# Patient Record
Sex: Male | Born: 1990 | Race: Black or African American | Hispanic: No | Marital: Married | Smoking: Never smoker
Health system: Southern US, Community
[De-identification: ages and names within clinical notes are randomized; demographics above are authoritative.]

## PROBLEM LIST (undated history)

## (undated) DIAGNOSIS — R011 Cardiac murmur, unspecified: Secondary | ICD-10-CM

## (undated) HISTORY — PX: OTHER SURGICAL HISTORY: SHX169

---
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
## 2012-07-06 ENCOUNTER — Emergency Department (HOSPITAL_COMMUNITY)
Admission: EM | Admit: 2012-07-06 | Discharge: 2012-07-06 | Disposition: A | Discharge status: Home / Self Care | Payer: Self-pay | Attending: Emergency Medicine | Admitting: Emergency Medicine

## 2012-07-06 ENCOUNTER — Emergency Department (HOSPITAL_COMMUNITY): Payer: Self-pay

## 2012-07-06 DIAGNOSIS — M25579 Pain in unspecified ankle and joints of unspecified foot: Secondary | ICD-10-CM | POA: Insufficient documentation

## 2012-07-06 DIAGNOSIS — Y9367 Activity, basketball: Secondary | ICD-10-CM | POA: Insufficient documentation

## 2012-07-06 DIAGNOSIS — Y92838 Other recreation area as the place of occurrence of the external cause: Secondary | ICD-10-CM | POA: Insufficient documentation

## 2012-07-06 DIAGNOSIS — S9000XA Contusion of unspecified ankle, initial encounter: Secondary | ICD-10-CM | POA: Insufficient documentation

## 2012-07-06 DIAGNOSIS — S93409A Sprain of unspecified ligament of unspecified ankle, initial encounter: Secondary | ICD-10-CM | POA: Insufficient documentation

## 2012-07-06 DIAGNOSIS — X500XXA Overexertion from strenuous movement or load, initial encounter: Secondary | ICD-10-CM | POA: Insufficient documentation

## 2012-07-06 DIAGNOSIS — M7989 Other specified soft tissue disorders: Secondary | ICD-10-CM | POA: Insufficient documentation

## 2012-07-06 DIAGNOSIS — Y9239 Other specified sports and athletic area as the place of occurrence of the external cause: Secondary | ICD-10-CM | POA: Insufficient documentation

## 2012-07-06 NOTE — ED Notes (Signed)
Pt was playing basketball last night and came down on someone else's foot. Pt states he rolled R ankle. Pt states he worked all day today and now pain is worse. Pt states pain now radiates up to lower back. Pt ambulatory at triage to exam room. Pt has bruising and swelling to R ankle.

## 2012-07-06 NOTE — ED Notes (Signed)
Pt states he drove himself here ?

## 2012-07-06 NOTE — ED Provider Notes (Signed)
History     CSN: [id number]  Arrival date & time 07/06/12  2014   First MD Initiated Contact with Patient 07/06/12 2035      Chief Complaint  Patient presents with  . Ankle Injury    (Consider location/radiation/quality/duration/timing/severity/associated sxs/prior treatment) HPI Comments: 21 year old male presents emergency department with right ankle pain after rolling it while playing basketball yesterday. States he was going to the ball when he jumped down and landed on miles and rolled his ankle. He try to "tough it out" last night and today at work, however he states the pain became worse causing his foot to be numb today. Describes the pain as constant, throbbing rated 8/10. He took Tylenol with only mild relief. Walking and moving his ankle makes the pain worse. It has become more swollen and bruised. Pain radiating up the side of his leg to his knee.  Patient is a 21 y.o. male presenting with lower extremity injury. The history is provided by the patient.  Ankle Injury Associated symptoms include arthralgias and joint swelling.    No past medical history on file.  No past surgical history on file.  No family history on file.  History  Substance Use Topics  . Smoking status: Not on file  . Smokeless tobacco: Not on file  . Alcohol Use: Not on file      Review of Systems  Musculoskeletal: Positive for joint swelling and arthralgias.  Skin: Positive for color change.    Allergies  Review of patient's allergies indicates no known allergies.  Home Medications   Current Outpatient Rx  Name  Route  Sig  Dispense  Refill  . ACETAMINOPHEN 325 MG PO TABS   Oral   Take 650 mg by mouth every 6 (six) hours as needed. Pain           BP 126/72  Pulse 89  Temp 99.3 F (37.4 C) (Oral)  Resp 17  SpO2 100%  Physical Exam  Nursing note and vitals reviewed. Constitutional: He is oriented to person, place, and time. He appears well-developed and well-nourished.  No distress.  HENT:  Head: Normocephalic and atraumatic.  Eyes: Conjunctivae normal are normal.  Neck: Normal range of motion.  Cardiovascular: Normal rate, regular rhythm, normal heart sounds and intact distal pulses.   Pulses:      Dorsalis pedis pulses are 2+ on the right side.       Posterior tibial pulses are 2+ on the right side.  Pulmonary/Chest: Effort normal and breath sounds normal.  Musculoskeletal:       Right ankle: He exhibits decreased range of motion (in all directions due to pain), swelling and ecchymosis. He exhibits normal pulse. tenderness. Lateral malleolus, medial malleolus, AITFL, CF ligament and posterior TFL tenderness found. Achilles tendon normal.  Neurological: He is alert and oriented to person, place, and time. No sensory deficit.  Skin: Skin is warm and dry. Bruising and ecchymosis noted.  Psychiatric: He has a normal mood and affect. His behavior is normal.    ED Course  Procedures (including critical care time)  Labs Reviewed - No data to display Dg Ankle Complete Right  07/06/2012  *RADIOLOGY REPORT*  Clinical Data: Injury  RIGHT ANKLE - COMPLETE 3+ VIEW  Comparison: None.  Findings: No acute fracture and no dislocation.  Severe soft tissue swelling about the ankle most prominent overlying the lateral malleolus. Moderate pes planus deformity.  IMPRESSION: No acute fracture or dislocation.  Marked soft tissue swelling about the ankle  is noted.   Original Report Authenticated By: Jolaine Click, M.D.      1. Ankle sprain       MDM  21 year old male with right ankle sprain. Conservative measures discussed in detail. Air splint applied along with crutches given.        Trevor Mace, PA-C 07/06/12 2117

## 2012-07-06 NOTE — ED Provider Notes (Signed)
Medical screening examination/treatment/procedure(s) were performed by non-physician practitioner and as supervising physician I was immediately available for consultation/collaboration.  Yuki Brunsman K Linker, MD 07/06/12 2146 

## 2013-04-17 ENCOUNTER — Emergency Department (HOSPITAL_COMMUNITY)
Admission: EM | Admit: 2013-04-17 | Discharge: 2013-04-17 | Disposition: A | Discharge status: Home / Self Care | Payer: Self-pay | Attending: Emergency Medicine | Admitting: Emergency Medicine

## 2013-04-17 ENCOUNTER — Encounter (HOSPITAL_COMMUNITY): Payer: Self-pay | Admitting: Emergency Medicine

## 2013-04-17 DIAGNOSIS — L293 Anogenital pruritus, unspecified: Secondary | ICD-10-CM | POA: Insufficient documentation

## 2013-04-17 MED ORDER — HYDROXYZINE HCL 25 MG PO TABS: 25.0000 mg | ORAL_TABLET | Freq: Four times a day (QID) | ORAL | Status: DC

## 2013-04-17 NOTE — ED Provider Notes (Signed)
CSN: [id number]     Arrival date & time 04/17/13  1630 History  This chart was scribed for Randy Sites, PA working with Richardean Canal, MD by Quintella Reichert, ED Scribe. This patient was seen in room WTR6/WTR6 and the patient's care was started at 6:16 PM.    Chief Complaint  Patient presents with  . Penis Itching    The history is provided by the patient. No language interpreter was used.    HPI Comments: KOUPER SPINELLA is a 22 y.o. male who presents to the Emergency Department complaining of one day of intermittent itching on the underside of his penis and top of his testicles.  No itching in pubic hair.  He states that itching is intermittent with no known precipitating factors and is relieved temporarily by scratching.  He denies penile pain, dysuria, or discharge.  He has not taken any Benadryl or other medications pta.  Pt denies new sexual partners but states it is possible he may have an STD.  He denies prior h/o STDs.  He denies h/o UTIs.  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  Denies any fevers, sweats, or chills.   History reviewed. No pertinent past medical history.   History reviewed. No pertinent past surgical history.   Family History  Problem Relation Age of Onset  . Hypertension Mother     History  Substance Use Topics  . Smoking status: Never Smoker   . Smokeless tobacco: Not on file  . Alcohol Use: Yes     Review of Systems  Genitourinary: Negative for dysuria, discharge and penile pain.       Penile itching  All other systems reviewed and are negative.      Allergies  Review of patient's allergies indicates no known allergies.  Home Medications  No current outpatient prescriptions on file.  BP 130/64  Pulse 69  Temp(Src) 99 F (37.2 C) (Oral)  Resp 18  SpO2 99%  Physical Exam  Nursing note and vitals reviewed. Constitutional: He is oriented to person, place, and time. He appears well-developed and  well-nourished. No distress.  HENT:  Head: Normocephalic and atraumatic.  Eyes: Conjunctivae and EOM are normal. Pupils are equal, round, and reactive to light.  Neck: Normal range of motion.  Cardiovascular: Normal rate, regular rhythm and normal heart sounds.   Pulmonary/Chest: Effort normal and breath sounds normal. No respiratory distress. He has no wheezes.  Abdominal: Soft.  Genitourinary: Penis normal. Right testis shows no tenderness. Left testis shows no tenderness. Circumcised. No phimosis, paraphimosis, hypospadias, penile erythema or penile tenderness. No discharge found.  Multiple cystic structures to both testicles-- non-tender, non-draining; no evidence of fungal or yeast infection, herpetic lesions, bites, or other rash present; no penile discharge or erythema  Musculoskeletal: Normal range of motion.  Neurological: He is alert and oriented to person, place, and time.  Skin: Skin is warm and dry. He is not diaphoretic.  Psychiatric: He has a normal mood and affect.    ED Course  Procedures (including critical care time)  DIAGNOSTIC STUDIES: Oxygen Saturation is 99% on room air, normal by my interpretation.    COORDINATION OF CARE: 6:22 PM: Discussed treatment plan which includes STD test and anti-itch medication.  Advised return precautions.  Pt expressed understanding and agreed to plan.   Labs Review Labs Reviewed - No data to display  Imaging Review No results found.  MDM   1. Itching of penis  Penile itching limited to dorsal surface and top of tesitcles without known cause.  No apparent fungal or yeast infection, herpetic lesions, other rash, or bug bites present.  Pt has multiple cystic structures to bilateral testicles-- this is not areas of sx and pt states they have been evaluated before and were told they were benign.  Rx atarax.  Gc/Chl pending-- informed he will be notified if results abnormal or require tx.  Discussed plan with pt, they agreed.   Return precautions advised.  I personally performed the services described in this documentation, which was scribed in my presence. The recorded information has been reviewed and is accurate.  Garlon Hatchet, PA-C 04/17/13 1933

## 2013-04-17 NOTE — ED Notes (Signed)
Pt reports penile itching x 2 days. Denies pain or drainage

## 2013-04-17 NOTE — ED Provider Notes (Signed)
Medical screening examination/treatment/procedure(s) were performed by non-physician practitioner and as supervising physician I was immediately available for consultation/collaboration.   David H Yao, MD 04/17/13 2311 

## 2014-07-11 ENCOUNTER — Emergency Department (HOSPITAL_COMMUNITY)
Admission: EM | Admit: 2014-07-11 | Discharge: 2014-07-11 | Disposition: A | Discharge status: Home / Self Care | Payer: 59 | Attending: Emergency Medicine | Admitting: Emergency Medicine

## 2014-07-11 ENCOUNTER — Encounter (HOSPITAL_COMMUNITY): Payer: Self-pay | Admitting: Emergency Medicine

## 2014-07-11 ENCOUNTER — Emergency Department (HOSPITAL_COMMUNITY): Payer: 59

## 2014-07-11 DIAGNOSIS — J029 Acute pharyngitis, unspecified: Secondary | ICD-10-CM | POA: Insufficient documentation

## 2014-07-11 DIAGNOSIS — R51 Headache: Secondary | ICD-10-CM | POA: Insufficient documentation

## 2014-07-11 DIAGNOSIS — J159 Unspecified bacterial pneumonia: Secondary | ICD-10-CM | POA: Insufficient documentation

## 2014-07-11 DIAGNOSIS — R05 Cough: Secondary | ICD-10-CM | POA: Diagnosis present

## 2014-07-11 DIAGNOSIS — H9201 Otalgia, right ear: Secondary | ICD-10-CM | POA: Diagnosis not present

## 2014-07-11 DIAGNOSIS — R109 Unspecified abdominal pain: Secondary | ICD-10-CM | POA: Diagnosis not present

## 2014-07-11 DIAGNOSIS — M2550 Pain in unspecified joint: Secondary | ICD-10-CM | POA: Insufficient documentation

## 2014-07-11 DIAGNOSIS — J189 Pneumonia, unspecified organism: Secondary | ICD-10-CM

## 2014-07-11 DIAGNOSIS — R059 Cough, unspecified: Secondary | ICD-10-CM

## 2014-07-11 MED ORDER — AZITHROMYCIN 250 MG PO TABS: 250.0000 mg | ORAL_TABLET | Freq: Every day | ORAL | Status: DC

## 2014-07-11 MED ORDER — PROMETHAZINE-DM 6.25-15 MG/5ML PO SYRP: 5.0000 mL | ORAL_SOLUTION | Freq: Four times a day (QID) | ORAL | Status: DC | PRN

## 2014-07-11 MED ORDER — AZITHROMYCIN 250 MG PO TABS
500.0000 mg | ORAL_TABLET | Freq: Once | ORAL | Status: AC
  Administered 2014-07-11: 500 mg via ORAL
  Filled 2014-07-11: qty 2

## 2014-07-11 MED ORDER — CEFTRIAXONE SODIUM 1 G IJ SOLR
1.0000 g | Freq: Once | INTRAMUSCULAR | Status: AC
  Administered 2014-07-11: 1 g via INTRAMUSCULAR
  Filled 2014-07-11: qty 10

## 2014-07-11 MED ORDER — IBUPROFEN 200 MG PO TABS
600.0000 mg | ORAL_TABLET | Freq: Once | ORAL | Status: AC
  Administered 2014-07-11: 600 mg via ORAL
  Filled 2014-07-11: qty 3

## 2014-07-11 MED ORDER — LIDOCAINE HCL 1 % IJ SOLN
INTRAMUSCULAR | Status: AC
  Administered 2014-07-11: 20 mL
  Filled 2014-07-11: qty 20

## 2014-07-11 NOTE — Discharge Instructions (Signed)
Your x-ray shows you have early stages of pneumonia.  You've been given antibiotic injection as well as tablets in the emergency department.  U been given a prescription for additional tablets.  Start these tomorrow, Thursday Thanksgiving in the evening.  You've also been given a prescription to help control your cough

## 2014-07-11 NOTE — ED Notes (Signed)
Pt states he has been coughing up cold for the past two weeks and this week he has had more of a dry cough  Pt states last week his neck was swollen under his jaw line but that is better  Pt is c/o sore throat and sinus headache with pain in his right ear  Pt also states he has pain in his abdomen, lower back, and pelvis with cough   Pt states he also has been having hot flashes

## 2014-07-11 NOTE — ED Provider Notes (Signed)
CSN: [id number]     Arrival date & time 07/11/14  2043 History  This chart was scribed for a non-physician practitioner, Arman FilterGail K Ananiah Maciolek, NP working with Rolan BuccoMelanie Belfi, MD by [place] Peace, ED Scribe. The patient was seen in WTR7/WTR7. The patient's care was started at 9:48 PM.    Chief Complaint  Patient presents with  . multiple complaints       The history is provided by the patient. No language interpreter was used.   HPI Comments: Randy Hamilton is a 23 y.o. male who presents to the Emergency Department complaining of cold symptoms onset 2 weeks including dry cough, sore throat, sinus headache, and right ear pain. He also complains of pain in his abdomen, lower back, and pelvis whenever he coughs. He adds that he experienced some moderate swelling under jaw line that has subsided. Pt is non-smoker.    History reviewed. No pertinent past medical history. History reviewed. No pertinent past surgical history. Family History  Problem Relation Age of Onset  . Hypertension Mother   . Cancer Mother   . Cancer Brother   . Cancer Other    History  Substance Use Topics  . Smoking status: Never Smoker   . Smokeless tobacco: Not on file  . Alcohol Use: Yes     Comment: occ    Review of Systems  HENT: Positive for congestion, ear pain (right), postnasal drip, rhinorrhea and sore throat.   Respiratory: Positive for cough. Negative for shortness of breath and wheezing.   Gastrointestinal: Positive for abdominal pain.  Genitourinary: Negative for dysuria.  Musculoskeletal: Positive for arthralgias.  Skin: Negative for rash.  Neurological: Positive for headaches.  All other systems reviewed and are negative.     Allergies  Review of patient's allergies indicates no known allergies.  Home Medications   Prior to Admission medications   Medication Sig Start Date End Date Taking? Authorizing Provider  azithromycin (ZITHROMAX) 250 MG tablet Take 1 tablet (250 mg total) by mouth  daily. 07/11/14   Arman FilterGail K Jacier Gladu, NP  hydrOXYzine (ATARAX/VISTARIL) 25 MG tablet Take 1 tablet (25 mg total) by mouth every 6 (six) hours. 04/17/13   Garlon HatchetLisa M Sanders, PA-C  promethazine-dextromethorphan (PROMETHAZINE-DM) 6.25-15 MG/5ML syrup Take 5 mLs by mouth 4 (four) times daily as needed for cough. 07/11/14   Arman FilterGail K Daiya Tamer, NP   BP 136/79 mmHg  Pulse 107  Temp(Src) 99.9 F (37.7 C) (Oral)  Resp 18  SpO2 94% Physical Exam  Constitutional: He is oriented to person, place, and time. He appears well-developed and well-nourished. No distress.  HENT:  Head: Normocephalic and atraumatic.  Right Ear: External ear normal.  Left Ear: External ear normal.  Mouth/Throat: Oropharynx is clear and moist. No oropharyngeal exudate.  Eyes: Conjunctivae and EOM are normal.  Neck: Neck supple. No tracheal deviation present.  Cardiovascular: Normal rate.   Pulmonary/Chest: Effort normal. No respiratory distress.  Abdominal: Soft. He exhibits no distension. There is no tenderness.  Musculoskeletal: Normal range of motion.  Lymphadenopathy:    He has cervical adenopathy.  Neurological: He is alert and oriented to person, place, and time.  Skin: Skin is warm and dry.  Psychiatric: He has a normal mood and affect. His behavior is normal.  Nursing note and vitals reviewed.   ED Course  Procedures (including critical care time) Labs Review Labs Reviewed - No data to display  Imaging Review Dg Chest 2 View  07/11/2014   CLINICAL DATA:  Dry cough, sore throat  for 2 weeks.  EXAM: CHEST  2 VIEW  COMPARISON:  None.  FINDINGS: Patchy opacity in the right upper lobe concerning for early infiltrate/ pneumonia. Left lung is clear. Heart is normal size. No effusions. No acute bony abnormality.  IMPRESSION: Suspect early infiltrate/ pneumonia in the right upper lobe.   Electronically Signed   By: Charlett NoseKevin  Dover M.D.   On: 07/11/2014 22:33     EKG Interpretation None     Medications  ibuprofen (ADVIL,MOTRIN)  tablet 600 mg (600 mg Oral Given 07/11/14 2251)  cefTRIAXone (ROCEPHIN) injection 1 g (1 g Intramuscular Given 07/11/14 2250)  azithromycin (ZITHROMAX) tablet 500 mg (500 mg Oral Given 07/11/14 2251)  lidocaine (XYLOCAINE) 1 % (with pres) injection (20 mLs  Given 07/11/14 2251)    9:51 PM- Treatment plan was discussed with patient who verbalizes understanding and agrees.  Will give IM Rocephin and start on azithromycin.  Patient will be given prescription for 4 additional days of azithromycin plus Phenergan with dextromethorphan cough medication MDM   Final diagnoses:  Cough  CAP (community acquired pneumonia)       I personally performed the services described in this documentation, which was scribed in my presence. The recorded information has been reviewed and is accurate.   Arman FilterGail K Jalaiya Oyster, NP 07/11/14 [id number]  Rolan BuccoMelanie Belfi, MD 07/11/14 2259

## 2015-06-30 ENCOUNTER — Encounter (HOSPITAL_COMMUNITY): Payer: Self-pay | Admitting: Emergency Medicine

## 2015-06-30 ENCOUNTER — Emergency Department (HOSPITAL_COMMUNITY)
Admission: EM | Admit: 2015-06-30 | Discharge: 2015-06-30 | Disposition: A | Discharge status: Home / Self Care | Payer: Self-pay | Attending: Emergency Medicine | Admitting: Emergency Medicine

## 2015-06-30 ENCOUNTER — Emergency Department (HOSPITAL_COMMUNITY): Payer: Self-pay

## 2015-06-30 DIAGNOSIS — M25462 Effusion, left knee: Secondary | ICD-10-CM

## 2015-06-30 DIAGNOSIS — Y9367 Activity, basketball: Secondary | ICD-10-CM | POA: Insufficient documentation

## 2015-06-30 DIAGNOSIS — Z79899 Other long term (current) drug therapy: Secondary | ICD-10-CM | POA: Insufficient documentation

## 2015-06-30 DIAGNOSIS — Y9289 Other specified places as the place of occurrence of the external cause: Secondary | ICD-10-CM | POA: Insufficient documentation

## 2015-06-30 DIAGNOSIS — M25562 Pain in left knee: Secondary | ICD-10-CM

## 2015-06-30 DIAGNOSIS — Y998 Other external cause status: Secondary | ICD-10-CM | POA: Insufficient documentation

## 2015-06-30 DIAGNOSIS — S83005A Unspecified dislocation of left patella, initial encounter: Secondary | ICD-10-CM

## 2015-06-30 DIAGNOSIS — Z792 Long term (current) use of antibiotics: Secondary | ICD-10-CM | POA: Insufficient documentation

## 2015-06-30 DIAGNOSIS — W01198A Fall on same level from slipping, tripping and stumbling with subsequent striking against other object, initial encounter: Secondary | ICD-10-CM | POA: Insufficient documentation

## 2015-06-30 MED ORDER — IBUPROFEN 800 MG PO TABS: 800.0000 mg | ORAL_TABLET | Freq: Three times a day (TID) | ORAL | Status: DC

## 2015-06-30 MED ORDER — OXYCODONE-ACETAMINOPHEN 5-325 MG PO TABS: 1.0000 | ORAL_TABLET | ORAL | Status: DC | PRN

## 2015-06-30 MED ORDER — OXYCODONE-ACETAMINOPHEN 5-325 MG PO TABS
2.0000 | ORAL_TABLET | Freq: Once | ORAL | Status: AC
  Administered 2015-06-30: 2 via ORAL
  Filled 2015-06-30: qty 2

## 2015-06-30 NOTE — ED Notes (Signed)
Pt c/o lt knee pain after an injury playing basketball an hour ago.

## 2015-06-30 NOTE — ED Notes (Signed)
Called ortho to bedside  

## 2015-06-30 NOTE — Discharge Instructions (Signed)
How to Use a Knee Brace A knee brace is a device that you wear to support your knee, especially if the knee is healing after an injury or surgery. There are several types of knee braces. Some are designed to prevent an injury (prophylactic brace). These are often worn during sports. Others support an injured knee (functional brace) or keep it still while it heals (rehabilitative brace). People with severe arthritis of the knee may benefit from a brace that takes some pressure off the knee (unloader brace). Most knee braces are made from a combination of cloth and metal or plastic.  You may need to wear a knee brace to:  Relieve knee pain.  Help your knee support your weight (improve stability).  Help you walk farther (improve mobility).  Prevent injury.  Support your knee while it heals from surgery or from an injury. RISKS AND COMPLICATIONS Generally, knee braces are very safe to wear. However, problems may occur, including:  Skin irritation that may lead to infection.  Making your condition worse if you wear the brace in the wrong way. HOW TO USE A KNEE BRACE Different braces will have different instructions for use. Your health care provider will tell you or show you:  How to put on your brace.  How to adjust the brace.  When and how often to wear the brace.  How to remove the brace.  If you will need any assistive devices in addition to the brace, such as crutches or a cane. In general, your brace should:  Have the hinge of the brace line up with the bend of your knee.  Have straps, hooks, or tapes that fasten snugly around your leg.  Not feel too tight or too loose. HOW TO CARE FOR A KNEE BRACE  Check your brace often for signs of damage, such as loose connections or attachments. Your knee brace may get damaged or wear out during normal use.  Wash the fabric parts of your brace with soap and water.  Read the insert that comes with your brace for other specific care  instructions. SEEK MEDICAL CARE IF:  Your knee brace is too loose or too tight and you cannot adjust it.  Your knee brace causes skin redness, swelling, bruising, or irritation.  Your knee brace is not helping.  Your knee brace is making your knee pain worse.   This information is not intended to replace advice given to you by your health care provider. Make sure you discuss any questions you have with your health care provider.   Document Released: 10/24/2003 Document Revised: 04/24/2015 Document Reviewed: 11/26/2014 Elsevier Interactive Patient Education 2016 [place].  Cryotherapy Cryotherapy means treatment with cold. Ice or gel packs can be used to reduce both pain and swelling. Ice is the most helpful within the first 24 to 48 hours after an injury or flare-up from overusing a muscle or joint. Sprains, strains, spasms, burning pain, shooting pain, and aches can all be eased with ice. Ice can also be used when recovering from surgery. Ice is effective, has very few side effects, and is safe for most people to use. PRECAUTIONS  Ice is not a safe treatment option for people with:  Raynaud phenomenon. This is a condition affecting small blood vessels in the extremities. Exposure to cold may cause your problems to return.  Cold hypersensitivity. There are many forms of cold hypersensitivity, including:  Cold urticaria. Red, itchy hives appear on the skin when the tissues begin to warm  after being iced.  Cold erythema. This is a red, itchy rash caused by exposure to cold.  Cold hemoglobinuria. Red blood cells break down when the tissues begin to warm after being iced. The hemoglobin that carry oxygen are passed into the urine because they cannot combine with blood proteins fast enough.  Numbness or altered sensitivity in the area being iced. If you have any of the following conditions, do not use ice until you have discussed cryotherapy with your caregiver:  Heart conditions,  such as arrhythmia, angina, or chronic heart disease.  High blood pressure.  Healing wounds or open skin in the area being iced.  Current infections.  Rheumatoid arthritis.  Poor circulation.  Diabetes. Ice slows the blood flow in the region it is applied. This is beneficial when trying to stop inflamed tissues from spreading irritating chemicals to surrounding tissues. However, if you expose your skin to cold temperatures for too long or without the proper protection, you can damage your skin or nerves. Watch for signs of skin damage due to cold. HOME CARE INSTRUCTIONS Follow these tips to use ice and cold packs safely.  Place a dry or damp towel between the ice and skin. A damp towel will cool the skin more quickly, so you may need to shorten the time that the ice is used.  For a more rapid response, add gentle compression to the ice.  Ice for no more than 10 to 20 minutes at a time. The bonier the area you are icing, the less time it will take to get the benefits of ice.  Check your skin after 5 minutes to make sure there are no signs of a poor response to cold or skin damage.  Rest 20 minutes or more between uses.  Once your skin is numb, you can end your treatment. You can test numbness by very lightly touching your skin. The touch should be so light that you do not see the skin dimple from the pressure of your fingertip. When using ice, most people will feel these normal sensations in this order: cold, burning, aching, and numbness.  Do not use ice on someone who cannot communicate their responses to pain, such as small children or people with dementia. HOW TO MAKE AN ICE PACK Ice packs are the most common way to use ice therapy. Other methods include ice massage, ice baths, and cryosprays. Muscle creams that cause a cold, tingly feeling do not offer the same benefits that ice offers and should not be used as a substitute unless recommended by your caregiver. To make an ice  pack, do one of the following:  Place crushed ice or a bag of frozen vegetables in a sealable plastic bag. Squeeze out the excess air. Place this bag inside another plastic bag. Slide the bag into a pillowcase or place a damp towel between your skin and the bag.  Mix 3 parts water with 1 part rubbing alcohol. Freeze the mixture in a sealable plastic bag. When you remove the mixture from the freezer, it will be slushy. Squeeze out the excess air. Place this bag inside another plastic bag. Slide the bag into a pillowcase or place a damp towel between your skin and the bag. SEEK MEDICAL CARE IF:  You develop white spots on your skin. This may give the skin a blotchy (mottled) appearance.  Your skin turns blue or pale.  Your skin becomes waxy or hard.  Your swelling gets worse. MAKE SURE YOU:   Understand  these instructions.  Will watch your condition.  Will get help right away if you are not doing well or get worse.   This information is not intended to replace advice given to you by your health care provider. Make sure you discuss any questions you have with your health care provider.   Document Released: 03/30/2011 Document Revised: 08/24/2014 Document Reviewed: 03/30/2011 Elsevier Interactive Patient Education 2016 [employer]  Knee Effusion Knee effusion means that you have excess fluid in your knee joint. This can cause pain and swelling in your knee. This may make your knee more difficult to bend and move. That is because there is increased pain and pressure in the joint. If there is fluid in your knee, it often means that something is wrong inside your knee, such as severe arthritis, abnormal inflammation, or an infection. Another common cause of knee effusion is an injury to the knee muscles, ligaments, or cartilage. HOME CARE INSTRUCTIONS  Use crutches as directed by your health care provider.  Wear a knee brace as directed by your health care provider.  Apply ice to the  swollen area:  Put ice in a plastic bag.  Place a towel between your skin and the bag.  Leave the ice on for 20 minutes, 2-3 times per day.  Keep your knee raised (elevated) when you are sitting or lying down.  Take medicines only as directed by your health care provider.  Do any rehabilitation or strengthening exercises as directed by your health care provider.  Rest your knee as directed by your health care provider. You may start doing your normal activities again when your health care provider approves.   Keep all follow-up visits as directed by your health care provider. This is important. SEEK MEDICAL CARE IF:  You have ongoing (persistent) pain in your knee. SEEK IMMEDIATE MEDICAL CARE IF:  You have increased swelling or redness of your knee.  You have severe pain in your knee.  You have a fever.   This information is not intended to replace advice given to you by your health care provider. Make sure you discuss any questions you have with your health care provider.   Document Released: 10/24/2003 Document Revised: 08/24/2014 Document Reviewed: 03/19/2014 Elsevier Interactive Patient Education 2016 [employer]  Patellar Dislocation A patellar dislocation occurs when your kneecap (patella) slips out of its normal position in a groove in front of the lower end of your thighbone (femur). This groove is called the patellofemoral groove.  CAUSES The kneecap is normally positioned over the front of the knee joint at the base of the thighbone. A kneecap can be dislocated when:  The kneecap is out of place (patellar tracking disorder), and force is applied.  The foot is firmly planted pointing outward, and the knee bends with the thigh turned inward. This kind of injury is common during many sports activities.  The inner edge of the kneecap is hit, pushing it toward the outer side of the leg. SIGNS AND SYMPTOMS  Severe pain.  A misshapen knee that looks like a bone  is out of position.  A popping sensation, followed by a feeling that something is out of place.  Inability to bend or straighten the knee.  Knee swelling.  Cool, pale skin or numbness and tingling in or below the affected knee. DIAGNOSIS  Your health care provider will physically examine the injured area. An X-ray exam may be done to make sure a bone fracture has not occurred. In  some cases, your health care provider may look inside your knee joint with an instrument much like a pencil-sized telescope (arthroscope). This may be done to make sure you have no loose cartilage in your joint. Loose cartilage is not visible on an X-ray image. TREATMENT  In many instances, the patella can be guided back into position without much difficulty. It often goes back into position by straightening the leg. Often, nothing more may be needed other than a brief period of immobilization followed by the exercises your health care provider recommends. If patellar dislocation starts to become frequent after the first incident, surgery may be needed to prevent your patella from slipping out of place. HOME CARE INSTRUCTIONS   Only take over-the-counter or prescription medicines for pain, discomfort, or fever as directed by your health care provider.  Use a knee brace if directed to do so by your health care provider.  Use crutches as instructed.  Apply ice to the injured knee:  Put ice in a plastic bag.  Place a towel between your skin and the bag.  Leave the ice on for 20 minutes, 2-3 times a day.  Follow your health care provider's instructions for doing any recommended range-of-motion exercises or other exercises. SEEK IMMEDIATE MEDICAL CARE IF:  You have increased pain or swelling in the knee that is not relieved with medicine.  You have increasing inflammation in the knee.  You have locking or catching of your knee. MAKE SURE YOU:  Understand these instructions.  Will watch your  condition.  Will get help right away if you are not doing well or get worse.   This information is not intended to replace advice given to you by your health care provider. Make sure you discuss any questions you have with your health care provider.   Document Released: 04/28/2001 Document Revised: 05/24/2013 Document Reviewed: 03/15/2013 Elsevier Interactive Patient Education [employer] Inc[employer]

## 2015-06-30 NOTE — ED Provider Notes (Signed)
CSN: [id number]     Arrival date & time 06/30/15  1807 History   First MD Initiated Contact with Patient 06/30/15 1932     Chief Complaint  Patient presents with  . Knee Pain     (Consider location/radiation/quality/duration/timing/severity/associated sxs/prior Treatment) HPI   Patient is a healthy 24 year old male, who presents emergency Department with injury to his left knee, complaining of swelling, pain and limited range of motion.  He was playing basketball approximately 1 hour PTA, and states that he had jumped up to take a shot when he was struck by another player shoulder. He states that it take his legs out from underneath him and he believes the other player's shoulder hit the outside of his left knee.  He also fell onto the knee and had immediate pain and progressive swelling. He thinks that he looked down and saw his patella dislocated and that it spontaneously went back in after he fell on the ground. He has never dislocated his patella before. He states that he has been forcing himself to walk on it and it has progressively more swollen over the last 2 hours.  He denies any color change, numbness, weakness.  He did not try any treatment before arrival in the ER, and he has no other complaints or injuries.  He denies any hip back neck chest or abdominal pain. He denies any headache, chest pain or shortness of breath.  History reviewed. No pertinent past medical history. No past surgical history on file. Family History  Problem Relation Age of Onset  . Hypertension Mother   . Cancer Mother   . Cancer Brother   . Cancer Other    Social History  Substance Use Topics  . Smoking status: Never Smoker   . Smokeless tobacco: None  . Alcohol Use: Yes     Comment: occ    Review of Systems  Constitutional: Negative.   HENT: Negative.   Respiratory: Negative.   Cardiovascular: Negative.   Gastrointestinal: Negative.   Musculoskeletal: Positive for joint swelling and  arthralgias. Negative for myalgias, back pain, neck pain and neck stiffness.  Skin: Negative for color change, pallor and wound.  Psychiatric/Behavioral: Negative.       Allergies  Review of patient's allergies indicates no known allergies.  Home Medications   Prior to Admission medications   Medication Sig Start Date End Date Taking? Authorizing Provider  azithromycin (ZITHROMAX) 250 MG tablet Take 1 tablet (250 mg total) by mouth daily. 07/11/14   Earley Favor, NP  hydrOXYzine (ATARAX/VISTARIL) 25 MG tablet Take 1 tablet (25 mg total) by mouth every 6 (six) hours. 04/17/13   Garlon Hatchet, PA-C  ibuprofen (ADVIL,MOTRIN) 800 MG tablet Take 1 tablet (800 mg total) by mouth 3 (three) times daily. 06/30/15   Danelle Berry, PA-C  oxyCODONE-acetaminophen (PERCOCET) 5-325 MG tablet Take 1 tablet by mouth every 4 (four) hours as needed. 06/30/15   Danelle Berry, PA-C  promethazine-dextromethorphan (PROMETHAZINE-DM) 6.25-15 MG/5ML syrup Take 5 mLs by mouth 4 (four) times daily as needed for cough. 07/11/14   Earley Favor, NP   BP 126/77 mmHg  Pulse 89  Temp(Src) 98.3 F (36.8 C) (Oral)  Resp 18  Ht  (1.854 m)  Wt 185 lb (83.915 kg)  BMI 24.41 kg/m2  SpO2 100% Physical Exam  Constitutional: He is oriented to person, place, and time. He appears well-developed and well-nourished. No distress.  HENT:  Head: Normocephalic and atraumatic.  Right Ear: External ear normal.  Left Ear:  External ear normal.  Nose: Nose normal.  Mouth/Throat: Oropharynx is clear and moist. No oropharyngeal exudate.  Eyes: Conjunctivae and EOM are normal. Pupils are equal, round, and reactive to light. Right eye exhibits no discharge. Left eye exhibits no discharge. No scleral icterus.  Neck: Normal range of motion. Neck supple. No JVD present. No tracheal deviation present.  Cardiovascular: Normal rate and regular rhythm.   Pulmonary/Chest: Effort normal and breath sounds normal. No stridor. No respiratory  distress.  Musculoskeletal: He exhibits edema and tenderness.  Left knee with large effusion palpated over the inferior half of the patella and surrounding area. Decreased active extension of left knee, normal flexion, limited only by effusion.  No erythema no warmth   Lymphadenopathy:    He has no cervical adenopathy.  Neurological: He is alert and oriented to person, place, and time. He exhibits normal muscle tone. Coordination normal.  Skin: Skin is warm and dry. No rash noted. He is not diaphoretic. No erythema. No pallor.  Psychiatric: He has a normal mood and affect. His behavior is normal. Judgment and thought content normal.  Nursing note and vitals reviewed.  ED Course  Procedures (including critical care time) Labs Review Labs Reviewed - No data to display  Imaging Review Dg Knee Complete 4 Views Left  06/30/2015  CLINICAL DATA:  Fall wall playing basketball with knee pain, initial encounter EXAM: LEFT KNEE - COMPLETE 4+ VIEW COMPARISON:  None. FINDINGS: No acute fracture or dislocation is noted. There is significant soft tissue swelling below the patella in the patella appears somewhat high-riding. This may represent some degree of infrapatellar ligament injury. IMPRESSION: Soft tissue swelling beneath the patella without acute bony abnormality. This may represent some infrapatellar ligament injury. Electronically Signed   By: Alcide CleverMark  Lukens M.D.   On: 06/30/2015 18:48   I have personally reviewed and evaluated these images and lab results as part of my medical decision-making.   EKG Interpretation None      MDM   Final diagnoses:  Knee joint effusion, left  Left knee pain  Dislocated patella, left, initial encounter    Patient with left knee injury with large effusion. X-rays pertinent for soft tissue swelling beneath the patella without any evidence of fracture. His suggestive of infrapatellar ligament injury.  This does correlate with physical exam. The size of the  effusion largely limits range of motion testing however the patient was treated for pain, placed in a knee immobilizer, and will follow up with Ortho.  Medications  oxyCODONE-acetaminophen (PERCOCET/ROXICET) 5-325 MG per tablet 2 tablet (2 tablets Oral Given 06/30/15 2004)   Filed Vitals:   06/30/15 1814 06/30/15 2006  BP: 120/69 126/77  Pulse: 78 89  Temp: 98.3 F (36.8 C) 98.3 F (36.8 C)  TempSrc: Oral Oral  Resp: 20 18  Height:  6\' 1"  (1.854 m)  Weight:  185 lb (83.915 kg)  SpO2: 100% 100%       Danelle BerryLeisa Tyquan Carmickle, PA-C 07/01/15 2252  Rolan BuccoMelanie Belfi, MD 07/02/15 1042

## 2015-07-02 ENCOUNTER — Encounter (HOSPITAL_COMMUNITY): Payer: Self-pay

## 2015-07-02 ENCOUNTER — Emergency Department (HOSPITAL_COMMUNITY)
Admission: EM | Admit: 2015-07-02 | Discharge: 2015-07-02 | Disposition: A | Discharge status: Home / Self Care | Payer: Self-pay | Attending: Emergency Medicine | Admitting: Emergency Medicine

## 2015-07-02 DIAGNOSIS — M25462 Effusion, left knee: Secondary | ICD-10-CM | POA: Insufficient documentation

## 2015-07-02 DIAGNOSIS — X58XXXA Exposure to other specified factors, initial encounter: Secondary | ICD-10-CM | POA: Insufficient documentation

## 2015-07-02 DIAGNOSIS — S8992XA Unspecified injury of left lower leg, initial encounter: Secondary | ICD-10-CM | POA: Insufficient documentation

## 2015-07-02 DIAGNOSIS — Y998 Other external cause status: Secondary | ICD-10-CM | POA: Insufficient documentation

## 2015-07-02 DIAGNOSIS — Y9384 Activity, sleeping: Secondary | ICD-10-CM | POA: Insufficient documentation

## 2015-07-02 DIAGNOSIS — M25562 Pain in left knee: Secondary | ICD-10-CM

## 2015-07-02 DIAGNOSIS — Z791 Long term (current) use of non-steroidal anti-inflammatories (NSAID): Secondary | ICD-10-CM | POA: Insufficient documentation

## 2015-07-02 DIAGNOSIS — Y9289 Other specified places as the place of occurrence of the external cause: Secondary | ICD-10-CM | POA: Insufficient documentation

## 2015-07-02 MED ORDER — OXYCODONE-ACETAMINOPHEN 5-325 MG PO TABS
2.0000 | ORAL_TABLET | Freq: Once | ORAL | Status: AC
  Administered 2015-07-02: 2 via ORAL
  Filled 2015-07-02: qty 2

## 2015-07-02 NOTE — ED Provider Notes (Signed)
CSN: [id number]     Arrival date & time 07/02/15  4098 History   First MD Initiated Contact with Patient 07/02/15 [phone]     Chief Complaint  Patient presents with  . Knee Pain     (Consider location/radiation/quality/duration/timing/severity/associated sxs/prior Treatment) The history is provided by the patient and medical records.    24 year old male here with left knee pain. He was seen 2 days ago in the emergency department for any increased lower playing basketball. It was suspected he had internal derangement of his left knee and he was placed in knee immobilizer. He states last night he was asleep on the couch without his knee immobilizer and left leg fell off the couch and twisted his knee. He states pain is now worsened. He did not fall off the couch or have any direct trauma to his knee. He states swelling has improved from 2 days ago but he continues to have pain. He denies numbness or weakness of his left leg. No prior history of knee injuries or surgeries prior to this. Patient has not filled his pain medication from the other day yet. He has scheduled an appointment with orthopedics for next week.  VSS.  History reviewed. No pertinent past medical history. History reviewed. No pertinent past surgical history. Family History  Problem Relation Age of Onset  . Hypertension Mother   . Cancer Mother   . Cancer Brother   . Cancer Other    Social History  Substance Use Topics  . Smoking status: Never Smoker   . Smokeless tobacco: Never Used  . Alcohol Use: Yes     Comment: occ    Review of Systems  Musculoskeletal: Positive for arthralgias.  All other systems reviewed and are negative.     Allergies  Review of patient's allergies indicates no known allergies.  Home Medications   Prior to Admission medications   Medication Sig Start Date End Date Taking? Authorizing Provider  acetaminophen (TYLENOL) 500 MG tablet Take 1,000 mg by mouth every 6 (six) hours as needed  for mild pain, moderate pain or headache.   Yes Historical Provider, MD  ibuprofen (ADVIL,MOTRIN) 800 MG tablet Take 1 tablet (800 mg total) by mouth 3 (three) times daily. 06/30/15   Danelle Berry, PA-C  oxyCODONE-acetaminophen (PERCOCET) 5-325 MG tablet Take 1 tablet by mouth every 4 (four) hours as needed. 06/30/15   Danelle Berry, PA-C   BP 113/73 mmHg  Pulse 87  Temp(Src) 98 F (36.7 C) (Oral)  Resp 16  Ht  (1.854 m)  Wt 185 lb (83.915 kg)  BMI 24.41 kg/m2  SpO2 100%   Physical Exam  Constitutional: He is oriented to person, place, and time. He appears well-developed and well-nourished. No distress.  HENT:  Head: Normocephalic and atraumatic.  Mouth/Throat: Oropharynx is clear and moist.  Eyes: Conjunctivae and EOM are normal. Pupils are equal, round, and reactive to light.  Neck: Normal range of motion. Neck supple.  Cardiovascular: Normal rate, regular rhythm and normal heart sounds.   Pulmonary/Chest: Effort normal and breath sounds normal. No respiratory distress. He has no wheezes.  Abdominal: Soft. Bowel sounds are normal. There is no tenderness. There is no guarding.  Musculoskeletal:       Left knee: He exhibits decreased range of motion, swelling and effusion. He exhibits no ecchymosis, no deformity, no laceration and no erythema. Tenderness found. Medial joint line tenderness noted.  Left knee diffusely swollen with palpable effusion, limited flexion due to pain and swelling, full  extension maintained; tenderness along medial joint line without acute bony deformity; patella feels appropriately located, tracking normally; quad contracting normally; normal DP pulse, sensation intact throughout left leg and foot  Neurological: He is alert and oriented to person, place, and time.  Skin: Skin is warm and dry. He is not diaphoretic.  Psychiatric: He has a normal mood and affect.  Nursing note and vitals reviewed.   ED Course  Procedures (including critical care time) Labs  Review Labs Reviewed - No data to display  Imaging Review Dg Knee Complete 4 Views Left  06/30/2015  CLINICAL DATA:  Fall wall playing basketball with knee pain, initial encounter EXAM: LEFT KNEE - COMPLETE 4+ VIEW COMPARISON:  None. FINDINGS: No acute fracture or dislocation is noted. There is significant soft tissue swelling below the patella in the patella appears somewhat high-riding. This may represent some degree of infrapatellar ligament injury. IMPRESSION: Soft tissue swelling beneath the patella without acute bony abnormality. This may represent some infrapatellar ligament injury. Electronically Signed   By: Alcide CleverMark  Lukens M.D.   On: 06/30/2015 18:48   I have personally reviewed and evaluated these images and lab results as part of my medical decision-making.   EKG Interpretation None      MDM   Final diagnoses:  Knee pain, left  Knee effusion, left   24 y.o. M with left knee pain.  Seen here 2 days ago for same, imaging negative.  States twisted knee again last night during his sleep when leg fell of couch.  No direct knee trauma.  Patient continues to have obvious knee effusion on exam. He has no acute bony deformities or signs of dislocation. His quad is contracting normally, decreased but patella also tracking normalluy. His leg is neurovascularly intact. Do not feel repeat x-ray required at this time as I have low suspicion for acute dislocation or fracture. Patient has arranged orthopedic follow-up for next week. I've encouraged him to keep this appointment as he will likely need an MRI. He has not yet filled his prescription for Percocet given him 2 days ago, encouraged him to take this for pain. Continue to ice and elevate knee at home to help with pain and swelling.  Discussed plan with patient, he/she acknowledged understanding and agreed with plan of care.  Return precautions given for new or worsening symptoms.  Garlon HatchetLisa M Kristina Mcnorton, PA-C 07/02/15 [id number]  Cathren LaineKevin Steinl,  MD 07/03/15 [phone]

## 2015-07-02 NOTE — ED Notes (Signed)
Pt alert and oriented x4. Respirations even and unlabored, bilateral symmetrical rise and fall of chest. Skin warm and dry. In no acute distress. Denies needs.   

## 2015-07-02 NOTE — ED Notes (Signed)
Patient states he was asleep on the couch and his left leg fell off and now the pain to the left knee is worse.

## 2015-07-02 NOTE — Discharge Instructions (Signed)
Taking percocet at home for pain.  Use caution, it can make you drowsy.. Continue to ice and elevate your knee at home to help with pain and swelling. Follow-up with orthopedics next week as scheduled. It is likely that you will need an MRI. Return here for any new or worsening symptoms.

## 2015-07-10 ENCOUNTER — Encounter (HOSPITAL_COMMUNITY): Payer: Self-pay | Admitting: *Deleted

## 2015-07-10 ENCOUNTER — Other Ambulatory Visit (HOSPITAL_COMMUNITY): Payer: Self-pay | Admitting: Orthopaedic Surgery

## 2015-07-12 ENCOUNTER — Ambulatory Visit (HOSPITAL_COMMUNITY): Payer: MEDICAID | Admitting: Anesthesiology

## 2015-07-12 ENCOUNTER — Encounter (HOSPITAL_COMMUNITY)
Admission: RE | Disposition: A | Discharge status: Home / Self Care | Payer: Self-pay | Source: Ambulatory Visit | Attending: Orthopaedic Surgery

## 2015-07-12 ENCOUNTER — Ambulatory Visit (HOSPITAL_COMMUNITY): Payer: Self-pay | Admitting: Anesthesiology

## 2015-07-12 ENCOUNTER — Encounter (HOSPITAL_COMMUNITY): Payer: Self-pay | Admitting: *Deleted

## 2015-07-12 ENCOUNTER — Observation Stay (HOSPITAL_COMMUNITY)
Admission: RE | Admit: 2015-07-12 | Discharge: 2015-07-13 | Disposition: A | Discharge status: Home / Self Care | Payer: Self-pay | Source: Ambulatory Visit | Attending: Orthopaedic Surgery | Admitting: Orthopaedic Surgery

## 2015-07-12 DIAGNOSIS — S86819A Strain of other muscle(s) and tendon(s) at lower leg level, unspecified leg, initial encounter: Secondary | ICD-10-CM | POA: Diagnosis present

## 2015-07-12 DIAGNOSIS — Z79891 Long term (current) use of opiate analgesic: Secondary | ICD-10-CM | POA: Insufficient documentation

## 2015-07-12 DIAGNOSIS — Z79899 Other long term (current) drug therapy: Secondary | ICD-10-CM | POA: Insufficient documentation

## 2015-07-12 DIAGNOSIS — W51XXXA Accidental striking against or bumped into by another person, initial encounter: Secondary | ICD-10-CM | POA: Insufficient documentation

## 2015-07-12 DIAGNOSIS — Z791 Long term (current) use of non-steroidal anti-inflammatories (NSAID): Secondary | ICD-10-CM | POA: Insufficient documentation

## 2015-07-12 DIAGNOSIS — S86812A Strain of other muscle(s) and tendon(s) at lower leg level, left leg, initial encounter: Secondary | ICD-10-CM

## 2015-07-12 DIAGNOSIS — S76112A Strain of left quadriceps muscle, fascia and tendon, initial encounter: Principal | ICD-10-CM | POA: Insufficient documentation

## 2015-07-12 HISTORY — DX: Cardiac murmur, unspecified: R01.1

## 2015-07-12 HISTORY — PX: PATELLAR TENDON REPAIR: SHX737

## 2015-07-12 SURGERY — REPAIR, TENDON, PATELLAR
Anesthesia: General | Site: Knee | Laterality: Left

## 2015-07-12 MED ORDER — ONDANSETRON HCL 4 MG/2ML IJ SOLN
INTRAMUSCULAR | Status: DC | PRN
  Administered 2015-07-12: 4 mg via INTRAVENOUS

## 2015-07-12 MED ORDER — METOCLOPRAMIDE HCL 5 MG/ML IJ SOLN
INTRAMUSCULAR | Status: DC | PRN
  Administered 2015-07-12: 10 mg via INTRAVENOUS

## 2015-07-12 MED ORDER — FENTANYL CITRATE (PF) 100 MCG/2ML IJ SOLN
INTRAMUSCULAR | Status: AC
  Filled 2015-07-12: qty 2

## 2015-07-12 MED ORDER — OXYCODONE-ACETAMINOPHEN 5-325 MG PO TABS: 1.0000 | ORAL_TABLET | ORAL | Status: AC | PRN

## 2015-07-12 MED ORDER — MIDAZOLAM HCL 2 MG/2ML IJ SOLN
INTRAMUSCULAR | Status: AC
  Filled 2015-07-12: qty 2

## 2015-07-12 MED ORDER — LACTATED RINGERS IV SOLN
INTRAVENOUS | Status: DC
Start: 2015-07-12 — End: 2015-07-12
  Administered 2015-07-12: 15:00:00 via INTRAVENOUS

## 2015-07-12 MED ORDER — PROPOFOL 10 MG/ML IV BOLUS
INTRAVENOUS | Status: AC
  Filled 2015-07-12: qty 20

## 2015-07-12 MED ORDER — LABETALOL HCL 5 MG/ML IV SOLN
5.0000 mg | INTRAVENOUS | Status: DC
  Administered 2015-07-12 (×2): 5 mg via INTRAVENOUS

## 2015-07-12 MED ORDER — 0.9 % SODIUM CHLORIDE (POUR BTL) OPTIME
TOPICAL | Status: DC | PRN
  Administered 2015-07-12: 1000 mL

## 2015-07-12 MED ORDER — ACETAMINOPHEN 500 MG PO TABS
ORAL_TABLET | ORAL | Status: AC
  Filled 2015-07-12: qty 2

## 2015-07-12 MED ORDER — DIPHENHYDRAMINE HCL 12.5 MG/5ML PO ELIX: 12.5000 mg | ORAL_SOLUTION | ORAL | Status: DC | PRN

## 2015-07-12 MED ORDER — HYDRALAZINE HCL 20 MG/ML IJ SOLN
3.0000 mg | INTRAMUSCULAR | Status: DC
  Administered 2015-07-12: 3 mg via INTRAVENOUS

## 2015-07-12 MED ORDER — PROMETHAZINE HCL 25 MG/ML IJ SOLN: 6.2500 mg | INTRAMUSCULAR | Status: DC | PRN

## 2015-07-12 MED ORDER — ACETAMINOPHEN 650 MG RE SUPP: 650.0000 mg | Freq: Four times a day (QID) | RECTAL | Status: DC | PRN

## 2015-07-12 MED ORDER — LABETALOL HCL 5 MG/ML IV SOLN
INTRAVENOUS | Status: AC
  Filled 2015-07-12: qty 4

## 2015-07-12 MED ORDER — CEFAZOLIN SODIUM-DEXTROSE 2-3 GM-% IV SOLR
INTRAVENOUS | Status: AC
  Filled 2015-07-12: qty 50

## 2015-07-12 MED ORDER — METOCLOPRAMIDE HCL 10 MG PO TABS: 5.0000 mg | ORAL_TABLET | Freq: Three times a day (TID) | ORAL | Status: DC | PRN

## 2015-07-12 MED ORDER — ONDANSETRON HCL 4 MG PO TABS: 4.0000 mg | ORAL_TABLET | Freq: Four times a day (QID) | ORAL | Status: DC | PRN

## 2015-07-12 MED ORDER — METOCLOPRAMIDE HCL 5 MG/ML IJ SOLN: 5.0000 mg | Freq: Three times a day (TID) | INTRAMUSCULAR | Status: DC | PRN

## 2015-07-12 MED ORDER — HYDROMORPHONE HCL 1 MG/ML IJ SOLN
1.0000 mg | INTRAMUSCULAR | Status: DC | PRN
  Administered 2015-07-12: 1 mg via INTRAVENOUS
  Filled 2015-07-12: qty 1

## 2015-07-12 MED ORDER — OXYCODONE HCL 5 MG PO TABS
5.0000 mg | ORAL_TABLET | ORAL | Status: DC | PRN
  Administered 2015-07-12: 10 mg via ORAL
  Administered 2015-07-13: 15 mg via ORAL
  Administered 2015-07-13 (×2): 10 mg via ORAL
  Administered 2015-07-13: 15 mg via ORAL
  Administered 2015-07-13: 10 mg via ORAL
  Filled 2015-07-12: qty 2
  Filled 2015-07-12: qty 3
  Filled 2015-07-12 (×2): qty 2
  Filled 2015-07-12: qty 3
  Filled 2015-07-12: qty 2

## 2015-07-12 MED ORDER — HYDRALAZINE HCL 20 MG/ML IJ SOLN
INTRAMUSCULAR | Status: AC
  Filled 2015-07-12: qty 1

## 2015-07-12 MED ORDER — CEFAZOLIN SODIUM 1-5 GM-% IV SOLN
1.0000 g | Freq: Four times a day (QID) | INTRAVENOUS | Status: AC
  Administered 2015-07-12 – 2015-07-13 (×3): 1 g via INTRAVENOUS
  Filled 2015-07-12 (×4): qty 50

## 2015-07-12 MED ORDER — MIDAZOLAM HCL 5 MG/5ML IJ SOLN
INTRAMUSCULAR | Status: DC | PRN
  Administered 2015-07-12: 2 mg via INTRAVENOUS

## 2015-07-12 MED ORDER — ONDANSETRON HCL 4 MG/2ML IJ SOLN: 4.0000 mg | Freq: Four times a day (QID) | INTRAMUSCULAR | Status: DC | PRN

## 2015-07-12 MED ORDER — HYDROMORPHONE HCL 1 MG/ML IJ SOLN
0.2500 mg | INTRAMUSCULAR | Status: DC | PRN
  Administered 2015-07-12 (×4): 0.5 mg via INTRAVENOUS

## 2015-07-12 MED ORDER — MEPERIDINE HCL 50 MG/ML IJ SOLN
12.5000 mg | Freq: Once | INTRAMUSCULAR | Status: AC
  Administered 2015-07-12: 12.5 mg via INTRAVENOUS

## 2015-07-12 MED ORDER — FENTANYL CITRATE (PF) 100 MCG/2ML IJ SOLN
INTRAMUSCULAR | Status: DC | PRN
  Administered 2015-07-12 (×8): 25 ug via INTRAVENOUS

## 2015-07-12 MED ORDER — ACETAMINOPHEN 325 MG PO TABS
ORAL_TABLET | ORAL | Status: DC | PRN
  Administered 2015-07-12: 1000 mg via ORAL

## 2015-07-12 MED ORDER — KETOROLAC TROMETHAMINE 30 MG/ML IJ SOLN
INTRAMUSCULAR | Status: DC | PRN
  Administered 2015-07-12: 30 mg via INTRAVENOUS

## 2015-07-12 MED ORDER — HYDROMORPHONE HCL 1 MG/ML IJ SOLN
INTRAMUSCULAR | Status: AC
Start: 2015-07-12 — End: 2015-07-13
  Filled 2015-07-12: qty 1

## 2015-07-12 MED ORDER — PROPOFOL 10 MG/ML IV BOLUS
INTRAVENOUS | Status: DC | PRN
  Administered 2015-07-12: 150 mg via INTRAVENOUS

## 2015-07-12 MED ORDER — METHOCARBAMOL 500 MG PO TABS: 500.0000 mg | ORAL_TABLET | Freq: Four times a day (QID) | ORAL | Status: DC | PRN

## 2015-07-12 MED ORDER — METHOCARBAMOL 1000 MG/10ML IJ SOLN
500.0000 mg | Freq: Four times a day (QID) | INTRAVENOUS | Status: DC | PRN
  Administered 2015-07-12: 500 mg via INTRAVENOUS
  Filled 2015-07-12 (×2): qty 5

## 2015-07-12 MED ORDER — CEFAZOLIN SODIUM-DEXTROSE 2-3 GM-% IV SOLR
2.0000 g | INTRAVENOUS | Status: AC
  Administered 2015-07-12: 2 g via INTRAVENOUS

## 2015-07-12 MED ORDER — ACETAMINOPHEN 325 MG PO TABS: 650.0000 mg | ORAL_TABLET | Freq: Four times a day (QID) | ORAL | Status: DC | PRN

## 2015-07-12 MED ORDER — SODIUM CHLORIDE 0.9 % IV SOLN
INTRAVENOUS | Status: DC
  Administered 2015-07-12 – 2015-07-13 (×2): via INTRAVENOUS

## 2015-07-12 MED ORDER — HYDROMORPHONE HCL 1 MG/ML IJ SOLN
INTRAMUSCULAR | Status: AC
  Filled 2015-07-12: qty 1

## 2015-07-12 MED ORDER — MEPERIDINE HCL 50 MG/ML IJ SOLN
INTRAMUSCULAR | Status: AC
  Filled 2015-07-12: qty 1

## 2015-07-12 SURGICAL SUPPLY — 44 items
BAG ZIPLOCK 12X15 (MISCELLANEOUS) ×2 IMPLANT
BANDAGE ELASTIC 6 VELCRO ST LF (GAUZE/BANDAGES/DRESSINGS) ×2 IMPLANT
BANDAGE ESMARK 6X9 LF (GAUZE/BANDAGES/DRESSINGS) ×1 IMPLANT
BENZOIN TINCTURE PRP APPL 2/3 (GAUZE/BANDAGES/DRESSINGS) ×2 IMPLANT
BNDG COHESIVE 6X5 TAN STRL LF (GAUZE/BANDAGES/DRESSINGS) ×2 IMPLANT
BNDG ESMARK 6X9 LF (GAUZE/BANDAGES/DRESSINGS) ×2
BNDG GAUZE ELAST 4 BULKY (GAUZE/BANDAGES/DRESSINGS) ×2 IMPLANT
CUFF TOURN SGL QUICK 34 (TOURNIQUET CUFF) ×1
CUFF TRNQT CYL 34X4X40X1 (TOURNIQUET CUFF) ×1 IMPLANT
DECANTER SPIKE VIAL GLASS SM (MISCELLANEOUS) ×2 IMPLANT
DRAPE U-SHAPE 47X51 STRL (DRAPES) ×2 IMPLANT
DRSG EMULSION OIL 3X16 NADH (GAUZE/BANDAGES/DRESSINGS) ×2 IMPLANT
DRSG PAD ABDOMINAL 8X10 ST (GAUZE/BANDAGES/DRESSINGS) IMPLANT
DURAPREP 26ML APPLICATOR (WOUND CARE) ×2 IMPLANT
ELECT REM PT RETURN 9FT ADLT (ELECTROSURGICAL) ×2
ELECTRODE REM PT RTRN 9FT ADLT (ELECTROSURGICAL) ×1 IMPLANT
GAUZE SPONGE 4X4 12PLY STRL (GAUZE/BANDAGES/DRESSINGS) ×2 IMPLANT
GLOVE BIO SURGEON STRL SZ7.5 (GLOVE) ×2 IMPLANT
GLOVE BIOGEL PI IND STRL 8 (GLOVE) ×1 IMPLANT
GLOVE BIOGEL PI INDICATOR 8 (GLOVE) ×1
GLOVE ECLIPSE 8.0 STRL XLNG CF (GLOVE) ×2 IMPLANT
GOWN STRL REUS W/TWL XL LVL3 (GOWN DISPOSABLE) ×2 IMPLANT
IMMOBILIZER KNEE 20 (SOFTGOODS) ×2 IMPLANT
IMMOBILIZER KNEE 20 THIGH 36 (SOFTGOODS) IMPLANT
PACK ORTHO EXTREMITY (CUSTOM PROCEDURE TRAY) ×2 IMPLANT
PAD ABD 8X10 STRL (GAUZE/BANDAGES/DRESSINGS) ×2 IMPLANT
PADDING CAST COTTON 6X4 STRL (CAST SUPPLIES) ×2 IMPLANT
PASSER SUT SWANSON 36MM LOOP (INSTRUMENTS) ×2 IMPLANT
POSITIONER SURGICAL ARM (MISCELLANEOUS) ×2 IMPLANT
SPONGE LAP 18X18 X RAY DECT (DISPOSABLE) IMPLANT
SPONGE LAP 4X18 X RAY DECT (DISPOSABLE) IMPLANT
STRIP CLOSURE SKIN 1/2X4 (GAUZE/BANDAGES/DRESSINGS) ×2 IMPLANT
SUT ETHIBOND NAB CT1 #1 30IN (SUTURE) ×2 IMPLANT
SUT FIBERWIRE 2-0 18 17.9 3/8 (SUTURE) ×2
SUT MNCRL AB 4-0 PS2 18 (SUTURE) ×4 IMPLANT
SUT VIC AB 0 CT1 27 (SUTURE) ×2
SUT VIC AB 0 CT1 27XBRD ANTBC (SUTURE) ×2 IMPLANT
SUT VIC AB 1 CT1 27 (SUTURE) ×1
SUT VIC AB 1 CT1 27XBRD ANTBC (SUTURE) ×1 IMPLANT
SUT VIC AB 2-0 CT1 27 (SUTURE) ×1
SUT VIC AB 2-0 CT1 TAPERPNT 27 (SUTURE) ×1 IMPLANT
SUTURE FIBERWR 2-0 18 17.9 3/8 (SUTURE) ×1 IMPLANT
TOWEL OR 17X26 10 PK STRL BLUE (TOWEL DISPOSABLE) ×4 IMPLANT
WRAP KNEE MAXI GEL POST OP (GAUZE/BANDAGES/DRESSINGS) ×2 IMPLANT

## 2015-07-12 NOTE — Brief Op Note (Signed)
07/12/2015  4:43 PM  PATIENT:  Randy Hamilton  [age] y.o. male  PRE-OPERATIVE DIAGNOSIS:  Left patella tendon rupture  POST-OPERATIVE DIAGNOSIS:  Left patella tendon rupture  PROCEDURE:  Procedure(s): DIRECT PRIMARY REPAIR LEFT PATELLA TENDON (Left)  SURGEON:  Surgeon(s) and Role:    * Kathryne Hitchhristopher Y Mico Spark, MD - Primary  PHYSICIAN ASSISTANT: Rexene EdisonGil Clark, PA-C  ANESTHESIA:   general  EBL:   minimal  COUNTS:  YES  TOURNIQUET:  * Missing tourniquet times found for documented tourniquets in log:  [id number] *  DICTATION: .Other Dictation: Dictation Number [id number]  PLAN OF CARE: Admit for overnight observation  PATIENT DISPOSITION:  PACU - hemodynamically stable.   Delay start of Pharmacological VTE agent (>24hrs) due to surgical blood loss or risk of bleeding: no

## 2015-07-12 NOTE — Anesthesia Postprocedure Evaluation (Signed)
Anesthesia Post Note  Patient: Randy Hamilton  Procedure(s) Performed: Procedure(s) (LRB): DIRECT PRIMARY REPAIR LEFT PATELLA TENDON (Left)  Patient location during evaluation: PACU Anesthesia Type: General Level of consciousness: awake and alert Pain management: pain level controlled Vital Signs Assessment: post-procedure vital signs reviewed and stable Respiratory status: spontaneous breathing, nonlabored ventilation, respiratory function stable and patient connected to nasal cannula oxygen Cardiovascular status: blood pressure returned to baseline and stable (Required some labetalol and hydralazine to get BP near baseline in PACU) Postop Assessment: No signs of nausea or vomiting Anesthetic complications: no    Last Vitals:  Filed Vitals:   07/12/15 1810 07/12/15 1815  BP: 154/91 152/88  Pulse: 57 60  Temp:    Resp: 10 12    Last Pain:  Filed Vitals:   07/12/15 1816  PainSc: Asleep    LLE Motor Response: Purposeful movement LLE Sensation: No numbness, No tingling          Jullian Clayson J

## 2015-07-12 NOTE — H&P (Signed)
Randy Hamilton is an 24 y.o. male.   Chief Complaint:   Left knee pain with inability to extend his knee HPI:   24 yo male who injured his left knee getting close to 2 weeks ago when he collided with another player.  Has the inability to extend his left knee and x-rays show a high-riding patella (patella alta) suggesting a rupture of his patella tendon.  He now presents for an operative intervention.  Past Medical History  Diagnosis Date  . Heart murmur     as a child     Past Surgical History  Procedure Laterality Date  . Epidermal cyst removed from testicle       Family History  Problem Relation Age of Onset  . Hypertension Mother   . Cancer Mother   . Cancer Brother   . Cancer Other    Social History:  reports that he has never smoked. He has never used smokeless tobacco. He reports that he drinks alcohol. He reports that he does not use illicit drugs.  Allergies: No Known Allergies  Medications Prior to Admission  Medication Sig Dispense Refill  . acetaminophen (TYLENOL) 500 MG tablet Take 1,000 mg by mouth every 6 (six) hours as needed for mild pain, moderate pain or headache.    . ibuprofen (ADVIL,MOTRIN) 800 MG tablet Take 1 tablet (800 mg total) by mouth 3 (three) times daily. 30 tablet 0  . oxyCODONE-acetaminophen (PERCOCET) 5-325 MG tablet Take 1 tablet by mouth every 4 (four) hours as needed. 20 tablet 0    No results found for this or any previous visit (from the past 48 hour(s)). No results found.  Review of Systems  All other systems reviewed and are negative.   Blood pressure 137/76, pulse 77, temperature 98.5 F (36.9 C), temperature source Oral, resp. rate 16, height 6\' 1"  (1.854 m), weight 78.926 kg (174 lb), SpO2 98 %. Physical Exam  Constitutional: He is oriented to person, place, and time. He appears well-developed and well-nourished.  HENT:  Head: Normocephalic and atraumatic.  Eyes: EOM are normal. Pupils are equal, round, and reactive to light.   Neck: Normal range of motion. Neck supple.  Cardiovascular: Normal rate and regular rhythm.   Respiratory: Effort normal and breath sounds normal.  GI: Soft. Bowel sounds are normal.  Musculoskeletal:       Left knee: He exhibits effusion and abnormal patellar mobility.  Neurological: He is alert and oriented to person, place, and time.  Skin: Skin is warm and dry.  Psychiatric: He has a normal mood and affect.     Assessment/Plan Acute left knee patella tendon rupture 1)  To the OR today for a direct primary repair of his left knee patella tendon followed by admission overnight for pain meds, PT, antibiotics and placement of a hinged knee brace.  Risks and benefits of surgery have been thoroughly discussed.  Mariko Nowakowski Y 07/12/2015, 2:04 PM

## 2015-07-12 NOTE — Transfer of Care (Signed)
Immediate Anesthesia Transfer of Care Note  Patient: Randy Hamilton  Procedure(s) Performed: Procedure(s): DIRECT PRIMARY REPAIR LEFT PATELLA TENDON (Left)  Patient Location: PACU  Anesthesia Type:General  Level of Consciousness:  sedated, patient cooperative and responds to stimulation  Airway & Oxygen Therapy:Patient Spontanous Breathing and Patient connected to face mask oxgen  Post-op Assessment:  Report given to PACU RN and Post -op Vital signs reviewed and stable  Post vital signs:  Reviewed and stable  Last Vitals:  Filed Vitals:   07/12/15 1348  BP: 137/76  Pulse: 77  Temp: 36.9 C  Resp: 16    Complications: No apparent anesthesia complications

## 2015-07-12 NOTE — Op Note (Signed)
Randy Hamilton:  Hamilton, Randy              ACCOUNT NO.:  [id number]  MEDICAL RECORD NO.:  [id number]  LOCATION:  [facility]                         [facility]  PHYSICIAN:  Vanita PandaChristopher Y. Magnus IvanBlackman, M.D.DATE OF BIRTH:  01-02-1991  DATE OF PROCEDURE:  07/12/2015 DATE OF DISCHARGE:                              OPERATIVE REPORT   PREOPERATIVE DIAGNOSIS:  Acute complete rupture of the left knee patella tendon.  POSTOPERATIVE DIAGNOSIS:  Acute complete rupture of the left knee patella tendon.  PROCEDURE:  Direct primary repair of acute left patella tendon rupture.  FINDINGS:  Complete rupture of the left knee patellar tendon.  SURGEON:  Kathryne Hitchhristopher Y Amy Belloso MD.  ASSISTING:  Randy CanalGilbert Clark, PA-C.  ANESTHESIA:  General.  ANTIBIOTICS:  3 g IV Ancef.  TOURNIQUET TIME:  Less than 1 hour.  BLOOD LOSS:  Minimal.  COMPLICATIONS:  None.  INDICATIONS:  [name] is 24 year old gentleman who almost 2 weeks ago sustained injury to his left knee when he ran into another player playing basketball.  He was seen in emergency room on the 13th and again on the 15th.  I believe my group was on-call, but somehow he did not get to the clinic of mine until this week on Wednesday.  Recognizes the inability to extend his knee on exam, deficit in the patellar tendon, and patella alta on x-rays.  We recognized that his patellar tendon was completely ruptured.  Recommended he undergo a surgery this week to perform a direct primary repair of this tendon.  He understands the risks and benefits of surgery in detail.  These were explained to him and he does understand the reasoning behind proceeding.  DESCRIPTION OF PROCEDURE:  After informed consent was obtained, appropriate left knee was marked.  He was brought to the operating room and placed supine on the operating table.  General anesthesia was then obtained.  A nonsterile tourniquet was placed around his upper left thigh, and his left leg was prepped  and draped from the thigh down the ankle with DuraPrep, sterile drapes, and a sterile stockinette.  Time- out was called to identify correct patient, correct left knee.  We then used an Esmarch to wrap out the leg and tourniquet was inflated to 300 mm of pressure.  I then made a direct midline incision over the patella and carried this proximally and distally.  We dissected down to the knee joint and then right away we could see that there was a complete disruption of the patellar tendon.  We irrigated the knee joint itself and then freshen up the end of patellar tendon and the end of the patella.  We then placed #2 FiberWire sutures in a Krackow format through the patellar tendon.  We then placed 3 temporary guide pins through the patella transversely and used a 3.5 mm drill bit to drill holes through the patella.  We then brought the #2 FiberWire sutures through the drill holes and tied them on the superior edge of the patella  over bony bridge.  We then over sewed the retinaculum that was disrupted and the patellar tendon itself with #1 Ethibond suture followed by reinforcing this with #1 Vicryl suture.  We then irrigated  the soft tissues, skin, closed the deep tissue with 0 Vicryl followed 2- 0 Vicryl in subcutaneous tissue, and 4-0 Monocryl subcuticular stitch. Steri-Strips were applied and well-padded sterile dressing was placed and tourniquet was let down.  His toes pinked nicely.  Well-padded sterile dressing was applied as well as a knee immobilizer.  He is awake, extubated, and taken to recovery room in stable condition.  All final counts were correct.  There were no complications noted. Postoperatively, we will keep in full extension and order older Bledsoe hinged knee brace with letting him full weight bearing on his knee, but no flexion at the knee until further notice.  Of note, Randy Canal, PA- C, his assistance was crucial throughout this case and he was present for the  entirety of the case.     Vanita Panda. [name], M.D.     CYB/MEDQ  D:  07/12/2015  T:  07/12/2015  Job:  [id number]

## 2015-07-12 NOTE — Discharge Instructions (Signed)
Do not bend your left knee at all. You can but full weight on your left leg in your knee brace. Expect knee swelling; ice as needed. Pump your feet occasionaly throughout the day to prevent blood clots. Take a full strength 325 mg aspirin daily.

## 2015-07-12 NOTE — Anesthesia Procedure Notes (Signed)
Procedure Name: LMA Insertion Date/Time: 07/12/2015 3:47 PM Performed by: Paris LoreBLANTON, Rayni Nemitz M Pre-anesthesia Checklist: Patient identified, Emergency Drugs available, Suction available, Patient being monitored and Timeout performed Patient Re-evaluated:Patient Re-evaluated prior to inductionOxygen Delivery Method: Circle system utilized Preoxygenation: Pre-oxygenation with 100% oxygen Intubation Type: IV induction Ventilation: Mask ventilation without difficulty LMA: LMA inserted LMA Size: 4.0 Number of attempts: 1 Placement Confirmation: positive ETCO2 and breath sounds checked- equal and bilateral Tube secured with: Tape

## 2015-07-12 NOTE — Anesthesia Preprocedure Evaluation (Addendum)
Anesthesia Evaluation  Patient identified by MRN, date of birth, ID band Patient awake    Reviewed: Allergy & Precautions, NPO status , Patient's Chart, lab work & pertinent test results  Airway Mallampati: II  TM Distance: >3 FB Neck ROM: Full    Dental no notable dental hx. (+)    Pulmonary neg pulmonary ROS,    Pulmonary exam normal breath sounds clear to auscultation       Cardiovascular Exercise Tolerance: Good negative cardio ROS Normal cardiovascular exam+ Valvular Problems/Murmurs  Rhythm:Regular Rate:Normal     Neuro/Psych negative neurological ROS  negative psych ROS   GI/Hepatic negative GI ROS, Neg liver ROS,   Endo/Other  negative endocrine ROS  Renal/GU negative Renal ROS  negative genitourinary   Musculoskeletal negative musculoskeletal ROS (+)   Abdominal   Peds negative pediatric ROS (+)  Hematology negative hematology ROS (+)   Anesthesia Other Findings   Reproductive/Obstetrics negative OB ROS                            Anesthesia Physical Anesthesia Plan  ASA: I  Anesthesia Plan: General   Post-op Pain Management:    Induction: Intravenous  Airway Management Planned: LMA  Additional Equipment:   Intra-op Plan:   Post-operative Plan: Extubation in OR  Informed Consent: I have reviewed the patients History and Physical, chart, labs and discussed the procedure including the risks, benefits and alternatives for the proposed anesthesia with the patient or authorized representative who has indicated his/her understanding and acceptance.   Dental advisory given  Plan Discussed with: CRNA  Anesthesia Plan Comments:        Anesthesia Quick Evaluation

## 2015-07-13 NOTE — Evaluation (Signed)
Physical Therapy Evaluation Patient Details Name: Randy Hamilton MRN: [id number] DOB: August 23, 1990 Today's Date: 07/13/2015   History of Present Illness  repair patella tendon rupture  Clinical Impression  Patient is ambulating well with crutches and RW. Requests RW for safety. No further  PT.    Follow Up Recommendations No PT follow up    Equipment Recommendations  Rolling walker with 5" wheels    Recommendations for Other Services       Precautions / Restrictions Precautions Precautions: Knee Required Braces or Orthoses: Other Brace/Splint Other Brace/Splint: Bledsoe at 0 flexion Restrictions Weight Bearing Restrictions: Yes LLE Weight Bearing: Weight bearing as tolerated      Mobility  Bed Mobility Overal bed mobility: Needs Assistance Bed Mobility: Supine to Sit     Supine to sit: Min assist     General bed mobility comments: support left leg  Transfers Overall transfer level: Needs assistance Equipment used: Rolling walker (2 wheeled);Crutches Transfers: Sit to/from Stand Sit to Stand: Min assist         General transfer comment: supporting left leg until it touched the ground  Ambulation/Gait Ambulation/Gait assistance: Supervision   Assistive device: Rolling walker (2 wheeled);Crutches Gait Pattern/deviations: Step-to pattern;Step-through pattern     General Gait Details: ambulated x 50' with RW qnd 13' with crutches  Stairs Stairs: Yes       General stair comments: patient declined need to practice, has been on crutches x 2 weeks.  Wheelchair Mobility    Modified Rankin (Stroke Patients Only)       Balance                                             Pertinent Vitals/Pain Pain Assessment: 0-10 Pain Score: 7  Pain Location: left knee Pain Descriptors / Indicators: Aching Pain Intervention(s): Limited activity within patient's tolerance;Monitored during session;Premedicated before session    Home Living  Family/patient expects to be discharged to:: Private residence Living Arrangements: Spouse/significant other;Children Available Help at Discharge: Family Type of Home: House Home Access: Stairs to enter   Secretary/administrator of Steps: 4 Home Layout: Two level;Able to live on main level with bedroom/bathroom Home Equipment: Crutches      Prior Function                 Hand Dominance        Extremity/Trunk Assessment   Upper Extremity Assessment: Overall WFL for tasks assessed           Lower Extremity Assessment: LLE deficits/detail   LLE Deficits / Details: assist to move the leg  Cervical / Trunk Assessment: Normal  Communication      Cognition Arousal/Alertness: Awake/alert Behavior During Therapy: Flat affect Overall Cognitive Status: Within Functional Limits for tasks assessed                      General Comments      Exercises        Assessment/Plan    PT Assessment Patent does not need any further PT services  PT Diagnosis Difficulty walking   PT Problem List    PT Treatment Interventions     PT Goals (Current goals can be found in the Care Plan section) Acute Rehab PT Goals Patient Stated Goal: to go home PT Goal Formulation: With patient/family Potential to Achieve Goals: Good    Frequency  Barriers to discharge        Co-evaluation               End of Session Equipment Utilized During Treatment: Gait belt Activity Tolerance: Patient tolerated treatment well Patient left: in chair;with call bell/phone within reach;with family/visitor present Nurse Communication: Mobility status    Functional Assessment Tool Used: clinical judgement Functional Limitation: Mobility: Walking and moving around Mobility: Walking and Moving Around Current Status ([id number](G8978): At least 1 percent but less than 20 percent impaired, limited or restricted Mobility: Walking and Moving Around Goal Status [phone](G8979): At least 1 percent but  less than 20 percent impaired, limited or restricted Mobility: Walking and Moving Around Discharge Status [phone](G8980): At least 1 percent but less than 20 percent impaired, limited or restricted    Time: 1228-1244 PT Time Calculation (min) (ACUTE ONLY): 16 min   Charges:   PT Evaluation $Initial PT Evaluation Tier I: 1 Procedure     PT G Codes:   PT G-Codes **NOT FOR INPATIENT CLASS** Functional Assessment Tool Used: clinical judgement Functional Limitation: Mobility: Walking and moving around Mobility: Walking and Moving Around Current Status ([id number](G8978): At least 1 percent but less than 20 percent impaired, limited or restricted Mobility: Walking and Moving Around Goal Status [phone](G8979): At least 1 percent but less than 20 percent impaired, limited or restricted Mobility: Walking and Moving Around Discharge Status [phone](G8980): At least 1 percent but less than 20 percent impaired, limited or restricted    Rada HayHill, Vedder Brittian Elizabeth 07/13/2015, 1:08 PM Blanchard KelchKaren Gizella [place] PT [phone]

## 2015-07-13 NOTE — Progress Notes (Signed)
Dr. Ophelia CharterYates aware via phone pt requested walker for home. See new order received for standard walker.

## 2015-07-13 NOTE — Discharge Summary (Signed)
Patient ID: Randy Hamilton MRN: [id number] DOB/AGE: October 13, 1990 24 y.o.  Admit date: 07/12/2015 Discharge date: 07/13/2015  Admission Diagnoses:  Principal Problem:   Rupture of left patellar tendon Active Problems:   Patellar tendon rupture   Discharge Diagnoses:  Same  Past Medical History  Diagnosis Date  . Heart murmur     as a child     Surgeries: Procedure(s): DIRECT PRIMARY REPAIR LEFT PATELLA TENDON on 07/12/2015   Consultants:    Discharged Condition: Improved  Hospital Course: Randy Hamilton is an 24 y.o. male who was admitted 07/12/2015 for operative treatment ofRupture of left patellar tendon. Patient has severe unremitting pain that affects sleep, daily activities, and work/hobbies. After pre-op clearance the patient was taken to the operating room on 07/12/2015 and underwent  Procedure(s): DIRECT PRIMARY REPAIR LEFT PATELLA TENDON.    Patient was given perioperative antibiotics: Anti-infectives    Start     Dose/Rate Route Frequency Ordered Stop   07/12/15 2200  ceFAZolin (ANCEF) IVPB 1 g/50 mL premix     1 g 100 mL/hr over 30 Minutes Intravenous Every 6 hours 07/12/15 1831 07/13/15 1559   07/12/15 1400  ceFAZolin (ANCEF) IVPB 2 g/50 mL premix     2 g 100 mL/hr over 30 Minutes Intravenous On call to O.R. 07/12/15 1350 07/12/15 1551       Patient was given sequential compression devices, early ambulation, and chemoprophylaxis to prevent DVT.  Patient benefited maximally from hospital stay and there were no complications.    Recent vital signs: Patient Vitals for the past 24 hrs:  BP Temp Temp src Pulse Resp SpO2 Height Weight  07/13/15 0506 (!) 142/83 mmHg 98.6 F (37 C) Oral 68 13 100 % - -  07/13/15 0112 (!) 145/81 mmHg 98.1 F (36.7 C) Oral 63 15 100 % - -  07/12/15 2130 (!) 162/75 mmHg 98.2 F (36.8 C) Oral 72 16 100 % - -  07/12/15 2030 (!) 153/70 mmHg 98.8 F (37.1 C) Oral 93 14 100 % - -  07/12/15 1930 (!) 142/73 mmHg 98.8 F (37.1 C)  Oral 82 16 100 % - -  07/12/15 1830 (!) 141/56 mmHg 98.4 F (36.9 C) Oral 64 14 100 % 6\' 1"  (1.854 m) 78.926 kg (174 lb)  07/12/15 1828 (!) 141/56 mmHg 98.4 F (36.9 C) - 64 14 100 % - -  07/12/15 1815 (!) 152/88 mmHg - - 60 12 100 % - -  07/12/15 1810 (!) 154/91 mmHg - - (!) 57 10 100 % - -  07/12/15 1805 (!) 150/99 mmHg - - (!) 56 12 100 % - -  07/12/15 1800 (!) 153/94 mmHg - - 60 10 100 % - -  07/12/15 1755 (!) 156/90 mmHg 98 F (36.7 C) - 60 12 100 % - -  07/12/15 1750 (!) 160/91 mmHg - - 63 13 100 % - -  07/12/15 1745 (!) 160/96 mmHg - - 60 12 100 % - -  07/12/15 1740 - - - 64 14 100 % - -  07/12/15 1730 (!) 162/96 mmHg - - 65 11 100 % - -  07/12/15 1715 (!) 166/89 mmHg - - 66 15 100 % - -  07/12/15 1710 (!) 145/84 mmHg 98.4 F (36.9 C) - 63 15 100 % - -  07/12/15 1348 137/76 mmHg 98.5 F (36.9 C) Oral 77 16 98 % 6\' 1"  (1.854 m) 78.926 kg (174 lb)     Recent laboratory studies: No results for  input(s): WBC, HGB, HCT, PLT, NA, K, CL, CO2, BUN, CREATININE, GLUCOSE, INR, CALCIUM in the last 72 hours.  Invalid input(s): PT, 2   Discharge Medications:     Medication List    TAKE these medications        acetaminophen 500 MG tablet  Commonly known as:  TYLENOL  Take 1,000 mg by mouth every 6 (six) hours as needed for mild pain, moderate pain or headache.     ibuprofen 800 MG tablet  Commonly known as:  ADVIL,MOTRIN  Take 1 tablet (800 mg total) by mouth 3 (three) times daily.     oxyCODONE-acetaminophen 5-325 MG tablet  Commonly known as:  PERCOCET  Take 1-2 tablets by mouth every 4 (four) hours as needed for severe pain.        Diagnostic Studies: Dg Knee Complete 4 Views Left  06/30/2015  CLINICAL DATA:  Fall wall playing basketball with knee pain, initial encounter EXAM: LEFT KNEE - COMPLETE 4+ VIEW COMPARISON:  None. FINDINGS: No acute fracture or dislocation is noted. There is significant soft tissue swelling below the patella in the patella appears somewhat  high-riding. This may represent some degree of infrapatellar ligament injury. IMPRESSION: Soft tissue swelling beneath the patella without acute bony abnormality. This may represent some infrapatellar ligament injury. Electronically Signed   By: Alcide Clever M.D.   On: 06/30/2015 18:48    Disposition: 01-Home or Self Care      Discharge Instructions    Call MD / Call 911    Complete by:  As directed   If you experience chest pain or shortness of breath, CALL 911 and be transported to the hospital emergency room.  If you develope a fever above 101 F, pus (white drainage) or increased drainage or redness at the wound, or calf pain, call your surgeon's office.     Constipation Prevention    Complete by:  As directed   Drink plenty of fluids.  Prune juice may be helpful.  You may use a stool softener, such as Colace (over the counter) 100 mg twice a day.  Use MiraLax (over the counter) for constipation as needed.     Diet - low sodium heart healthy    Complete by:  As directed      Discharge patient    Complete by:  As directed      Increase activity slowly as tolerated    Complete by:  As directed            Follow-up Information    Follow up with Kathryne Hitch, MD In 2 weeks.   Specialty:  Orthopedic Surgery   Contact information:   [address] [place] [address] [phone]        Signed: Kathryne Hitch 07/13/2015, 9:33 AM

## 2015-07-13 NOTE — Progress Notes (Signed)
Patient ID: Randy Hamilton, male   DOB: 11-04-1990, 24 y.o.   MRN: [id number] Anticipate being able to discharge to home later this afternoon.  Can not go home until Bledsoe knee brace applied.  Can put full weight as tolerated on his left leg, but keep at full extension.

## 2015-07-13 NOTE — Progress Notes (Signed)
PT Cancellation Note  Patient Details Name: Randy Hamilton D Ware MRN: [id number] DOB: 04/28/1991   Cancelled Treatment:    Reason Eval/Treat Not Completed: Other (comment) (await Bledsoe)   Rada HayHill, Velita Quirk Elizabeth 07/13/2015, 10:20 AM

## 2015-07-13 NOTE — Progress Notes (Signed)
Subjective: 1 Day Post-Op Procedure(s) (LRB): DIRECT PRIMARY REPAIR LEFT PATELLA TENDON (Left) Patient reports pain as severe.    Objective: Vital signs in last 24 hours: Temp:  [98 F (36.7 C)-98.8 F (37.1 C)] 98.6 F (37 C) (11/26 0506) Pulse Rate:  [56-93] 68 (11/26 0506) Resp:  [10-16] 13 (11/26 0506) BP: (137-166)/(56-99) 142/83 mmHg (11/26 0506) SpO2:  [98 %-100 %] 100 % (11/26 0506) Weight:  [78.926 kg (174 lb)] 78.926 kg (174 lb) (11/25 1830)  Intake/Output from previous day: 11/25 0701 - 11/26 0700 In: 2095 [P.O.:240; I.V.:1800; IV Piggyback:55] Out: 1025 [Urine:1025] Intake/Output this shift:    No results for input(s): HGB in the last 72 hours. No results for input(s): WBC, RBC, HCT, PLT in the last 72 hours. No results for input(s): NA, K, CL, CO2, BUN, CREATININE, GLUCOSE, CALCIUM in the last 72 hours. No results for input(s): LABPT, INR in the last 72 hours.  Neurologically intact  Assessment/Plan: 1 Day Post-Op Procedure(s) (LRB): DIRECT PRIMARY REPAIR LEFT PATELLA TENDON (Left) Up with therapy  YATES,MARK C 07/13/2015, 8:13 AM

## 2015-07-13 NOTE — Progress Notes (Signed)
Pt declined walker at discharge.

## 2015-07-13 NOTE — Progress Notes (Signed)
Assessment unchanged. Pt verbalized understanding of dc instructions through teach back regarding follow up care and when to call the doctor. Pt had declined walker as ordered per pt request. Crutches at bedside for home. PT reported pt ambulating well with crutches. Discharged via wc to front entrance to meet awaiting vehicle to carry home. Accompanied by wife and NT.

## 2015-07-15 ENCOUNTER — Encounter (HOSPITAL_COMMUNITY): Payer: Self-pay | Admitting: Orthopaedic Surgery

## 2015-08-09 ENCOUNTER — Emergency Department (HOSPITAL_COMMUNITY): Payer: Self-pay

## 2015-08-09 ENCOUNTER — Encounter (HOSPITAL_COMMUNITY): Payer: Self-pay | Admitting: Emergency Medicine

## 2015-08-09 ENCOUNTER — Emergency Department (HOSPITAL_COMMUNITY)
Admission: EM | Admit: 2015-08-09 | Discharge: 2015-08-09 | Disposition: A | Discharge status: Home / Self Care | Payer: Self-pay | Attending: Emergency Medicine | Admitting: Emergency Medicine

## 2015-08-09 DIAGNOSIS — S4991XA Unspecified injury of right shoulder and upper arm, initial encounter: Secondary | ICD-10-CM | POA: Insufficient documentation

## 2015-08-09 DIAGNOSIS — S4992XA Unspecified injury of left shoulder and upper arm, initial encounter: Secondary | ICD-10-CM | POA: Insufficient documentation

## 2015-08-09 DIAGNOSIS — Y998 Other external cause status: Secondary | ICD-10-CM | POA: Insufficient documentation

## 2015-08-09 DIAGNOSIS — R011 Cardiac murmur, unspecified: Secondary | ICD-10-CM | POA: Insufficient documentation

## 2015-08-09 DIAGNOSIS — S161XXA Strain of muscle, fascia and tendon at neck level, initial encounter: Secondary | ICD-10-CM

## 2015-08-09 DIAGNOSIS — Y92410 Unspecified street and highway as the place of occurrence of the external cause: Secondary | ICD-10-CM | POA: Insufficient documentation

## 2015-08-09 DIAGNOSIS — S0081XA Abrasion of other part of head, initial encounter: Secondary | ICD-10-CM | POA: Insufficient documentation

## 2015-08-09 DIAGNOSIS — Y9389 Activity, other specified: Secondary | ICD-10-CM | POA: Insufficient documentation

## 2015-08-09 DIAGNOSIS — S3992XA Unspecified injury of lower back, initial encounter: Secondary | ICD-10-CM | POA: Insufficient documentation

## 2015-08-09 MED ORDER — CYCLOBENZAPRINE HCL 5 MG PO TABS: 5.0000 mg | ORAL_TABLET | Freq: Two times a day (BID) | ORAL | Status: DC | PRN

## 2015-08-09 MED ORDER — CYCLOBENZAPRINE HCL 10 MG PO TABS
5.0000 mg | ORAL_TABLET | Freq: Once | ORAL | Status: AC
  Administered 2015-08-09: 5 mg via ORAL
  Filled 2015-08-09: qty 1
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
## 2015-08-09 NOTE — Discharge Instructions (Signed)
Take motrin for pain.  Take flexeril for muscle spasms.   Take percocet as prescribed by your orthopedic doctor.  See your doctor.   Return to ER if you have worse pain, headaches, vomiting.

## 2015-08-09 NOTE — ED Notes (Signed)
Bed: WA30 Expected date:  Expected time:  Means of arrival:  Comments: 
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
## 2015-08-09 NOTE — Progress Notes (Signed)
EDCM spoke to patient at bedside. Patient confirms he [place] a pcp [place].  [facility], informed patient of services there.  EDCM also provided patient with list of pcps who accept self pay patients, list of discount pharmacies and websites needymeds.org and GoodRX.com for medication assistance, phone number to inquire about the orange card, phone number to inquire about Mediciad, phone number to inquire about the Affordable Care Act, financial resources in the community such as local churches, salvation [employer], urban ministries, and dental assistance for uninsured patients.  Patient thankful for resources.  No further EDCM needs at this time. Patient reports he will be getting insurance starting at the beginning of the year and that his Medcaid is pending.
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
## 2017-10-04 ENCOUNTER — Emergency Department (HOSPITAL_COMMUNITY): Payer: 59

## 2017-10-04 ENCOUNTER — Other Ambulatory Visit: Payer: Self-pay

## 2017-10-04 ENCOUNTER — Inpatient Hospital Stay (HOSPITAL_COMMUNITY)
Admission: EM | Admit: 2017-10-04 | Discharge: 2017-10-09 | DRG: 558 | Disposition: A | Discharge status: Home / Self Care | Payer: 59 | Attending: Internal Medicine | Admitting: Internal Medicine

## 2017-10-04 ENCOUNTER — Encounter (HOSPITAL_COMMUNITY): Payer: Self-pay | Admitting: Emergency Medicine

## 2017-10-04 DIAGNOSIS — M549 Dorsalgia, unspecified: Secondary | ICD-10-CM

## 2017-10-04 DIAGNOSIS — R112 Nausea with vomiting, unspecified: Secondary | ICD-10-CM

## 2017-10-04 DIAGNOSIS — T796XXA Traumatic ischemia of muscle, initial encounter: Secondary | ICD-10-CM | POA: Diagnosis not present

## 2017-10-04 DIAGNOSIS — R740 Nonspecific elevation of levels of transaminase and lactic acid dehydrogenase [LDH]: Secondary | ICD-10-CM | POA: Diagnosis not present

## 2017-10-04 DIAGNOSIS — M545 Low back pain: Secondary | ICD-10-CM | POA: Diagnosis present

## 2017-10-04 DIAGNOSIS — M6282 Rhabdomyolysis: Secondary | ICD-10-CM | POA: Diagnosis not present

## 2017-10-04 DIAGNOSIS — R945 Abnormal results of liver function studies: Secondary | ICD-10-CM | POA: Diagnosis not present

## 2017-10-04 LAB — COMPREHENSIVE METABOLIC PANEL
ALBUMIN: 4.3 g/dL (ref 3.5–5.0)
ALK PHOS: 88 U/L (ref 38–126)
ALT: 476 U/L — ABNORMAL HIGH (ref 17–63)
ANION GAP: 14 (ref 5–15)
AST: 1024 U/L — ABNORMAL HIGH (ref 15–41)
BUN: 15 mg/dL (ref 6–20)
CALCIUM: 9.3 mg/dL (ref 8.9–10.3)
CO2: 23 mmol/L (ref 22–32)
Chloride: 99 mmol/L — ABNORMAL LOW (ref 101–111)
Creatinine, Ser: 1.06 mg/dL (ref 0.61–1.24)
GFR calc non Af Amer: >60 mL/min (ref >60)
GLUCOSE: 116 mg/dL — AB (ref 65–99)
POTASSIUM: 4.1 mmol/L (ref 3.5–5.1)
SODIUM: 136 mmol/L (ref 135–145)
TOTAL PROTEIN: 7.9 g/dL (ref 6.5–8.1)
Total Bilirubin: 1.2 mg/dL (ref 0.3–1.2)

## 2017-10-04 LAB — ETHANOL: Alcohol, Ethyl (B): <10 mg/dL (ref <10)

## 2017-10-04 LAB — URINALYSIS, ROUTINE W REFLEX MICROSCOPIC
Bilirubin Urine: NEGATIVE
GLUCOSE, UA: NEGATIVE mg/dL
KETONES UR: 20 mg/dL — AB
Leukocytes, UA: NEGATIVE
NITRITE: NEGATIVE
PROTEIN: 100 mg/dL — AB
Specific Gravity, Urine: 1.026 (ref 1.005–1.030)
pH: 6 (ref 5.0–8.0)

## 2017-10-04 LAB — RAPID URINE DRUG SCREEN, HOSP PERFORMED
AMPHETAMINES: NOT DETECTED
BARBITURATES: NOT DETECTED
BENZODIAZEPINES: NOT DETECTED
COCAINE: NOT DETECTED
Opiates: POSITIVE — AB
TETRAHYDROCANNABINOL: NOT DETECTED

## 2017-10-04 LAB — CBC
HEMATOCRIT: 44.7 % (ref 39.0–52.0)
HEMOGLOBIN: 15.2 g/dL (ref 13.0–17.0)
MCH: 23.4 pg — AB (ref 26.0–34.0)
MCHC: 34 g/dL (ref 30.0–36.0)
MCV: 68.8 fL — ABNORMAL LOW (ref 78.0–100.0)
Platelets: 246 10*3/uL (ref 150–400)
RBC: 6.5 MIL/uL — AB (ref 4.22–5.81)
RDW: 14.8 % (ref 11.5–15.5)
WBC: 12.1 10*3/uL — ABNORMAL HIGH (ref 4.0–10.5)

## 2017-10-04 LAB — CK

## 2017-10-04 LAB — LIPASE, BLOOD: Lipase: 22 U/L (ref 11–51)

## 2017-10-04 LAB — ACETAMINOPHEN LEVEL: Acetaminophen (Tylenol), Serum: <10 ug/mL — ABNORMAL LOW (ref 10–30)

## 2017-10-04 MED ORDER — SODIUM CHLORIDE 0.9 % IV BOLUS (SEPSIS)
1000.0000 mL | Freq: Once | INTRAVENOUS | Status: AC
  Administered 2017-10-04: 1000 mL via INTRAVENOUS

## 2017-10-04 MED ORDER — SODIUM CHLORIDE 0.9 % IV SOLN
INTRAVENOUS | Status: AC
  Administered 2017-10-04 – 2017-10-05 (×2): via INTRAVENOUS

## 2017-10-04 MED ORDER — CYCLOBENZAPRINE HCL 5 MG PO TABS
5.0000 mg | ORAL_TABLET | Freq: Two times a day (BID) | ORAL | Status: DC | PRN
  Administered 2017-10-05: 5 mg via ORAL
  Filled 2017-10-04: qty 1

## 2017-10-04 MED ORDER — MORPHINE SULFATE (PF) 4 MG/ML IV SOLN
4.0000 mg | Freq: Once | INTRAVENOUS | Status: AC
  Administered 2017-10-04: 4 mg via INTRAVENOUS
  Filled 2017-10-04: qty 1

## 2017-10-04 MED ORDER — HYDROCODONE-ACETAMINOPHEN 5-325 MG PO TABS
1.0000 | ORAL_TABLET | ORAL | Status: DC | PRN
  Administered 2017-10-04 – 2017-10-05 (×2): 1 via ORAL
  Filled 2017-10-04 (×3): qty 1
  Filled 2017-10-04: qty 2
  Filled 2017-10-04: qty 1

## 2017-10-04 MED ORDER — ACETAMINOPHEN 650 MG RE SUPP: 650.0000 mg | Freq: Four times a day (QID) | RECTAL | Status: DC | PRN

## 2017-10-04 MED ORDER — ONDANSETRON 4 MG PO TBDP
4.0000 mg | ORAL_TABLET | Freq: Once | ORAL | Status: AC | PRN
  Administered 2017-10-04: 4 mg via ORAL
  Filled 2017-10-04: qty 1

## 2017-10-04 MED ORDER — KETOROLAC TROMETHAMINE 30 MG/ML IJ SOLN
30.0000 mg | Freq: Four times a day (QID) | INTRAMUSCULAR | Status: DC | PRN
Start: 2017-10-04 — End: 2017-10-09
  Administered 2017-10-04 – 2017-10-09 (×10): 30 mg via INTRAVENOUS
  Filled 2017-10-04 (×12): qty 1

## 2017-10-04 MED ORDER — IOPAMIDOL (ISOVUE-300) INJECTION 61%
INTRAVENOUS | Status: AC
  Administered 2017-10-04: 100 mL
  Filled 2017-10-04: qty 100

## 2017-10-04 MED ORDER — ACETAMINOPHEN 325 MG PO TABS
650.0000 mg | ORAL_TABLET | Freq: Four times a day (QID) | ORAL | Status: DC | PRN
  Administered 2017-10-05 – 2017-10-08 (×3): 650 mg via ORAL
  Filled 2017-10-04 (×3): qty 2

## 2017-10-04 MED ORDER — SENNOSIDES-DOCUSATE SODIUM 8.6-50 MG PO TABS: 1.0000 | ORAL_TABLET | Freq: Every evening | ORAL | Status: DC | PRN

## 2017-10-04 MED ORDER — IBUPROFEN 800 MG PO TABS: 800.0000 mg | ORAL_TABLET | Freq: Two times a day (BID) | ORAL | Status: DC | PRN

## 2017-10-04 NOTE — H&P (Signed)
History and Physical    Randy Hamilton:[id number] DOB: [birth year] DOA: 10/04/2017  PCP: Patient, No Pcp Per Patient coming from: Home  Chief Complaint: Low back pain  HPI: Randy Hamilton is a 27 y.o. male without any significant past medical history came to the hospital with complains of low back pain.  Patient states 2 days ago he was doing heavy weight lifting and after doing dead lift he felt some amount of low back pain.  Initially he thought this was some soreness and later went zip lining.  When he returned home his back pain worsened and he was unable to lay flat and lay still.  This persisted the following day therefore took 800 mg of ibuprofen 2 or 3 times a day and Flexeril the following day.  Initially it helped a little but this continued to worsen therefore he came to the ER today.  During this time he noticed his urine was also turning dark.  Denies taking any Tylenol, alcohol intake and other complaints.  He denies any abdominal pain. Due to this back pain he does admit a feeling of some nausea and one episode of nonbloody vomiting earlier today.  In the ER patient was noted to be in significant amount of pain especially with movement and tenderness to palpation around his paraspinal region.  CT of the abdomen pelvis was negative.  UDS was only positive for opioids.  Alcohol level was negative.  UA showed positive for hemoglobin dipstick.  Lab tried to run his CK level but unable to obtain exact number despite of trying diluted 13 times.  They suspect this is greater than 50,000.  Also noted to have elevated AST and ALT level.  The patient has been started on IV fluids and admitted for further workup and care   Review of Systems: As per HPI otherwise 10 point review of systems negative.   Past Medical History:  Diagnosis Date  . Heart murmur    as a child     Past Surgical History:  Procedure Laterality Date  . epidermal cyst removed from testicle     . PATELLAR  TENDON REPAIR Left 07/12/2015   Procedure: DIRECT PRIMARY REPAIR LEFT PATELLA TENDON;  Surgeon: Kathryne Hitch, MD;  Location: [facility];  Service: Orthopedics;  Laterality: Left;     reports that  has never smoked. he has never used smokeless tobacco. He reports that he drinks alcohol. He reports that he does not use drugs.  No Known Allergies  Family History  Problem Relation Age of Onset  . Hypertension Mother   . Cancer Mother   . Cancer Brother   . Cancer Other      Prior to Admission medications   Medication Sig Start Date End Date Taking? Authorizing Provider  cyclobenzaprine (FLEXERIL) 5 MG tablet Take 1 tablet (5 mg total) by mouth 2 (two) times daily as needed for muscle spasms. 08/09/15  Yes Charlynne Pander, MD  ibuprofen (ADVIL,MOTRIN) 200 MG tablet Take 800 mg by mouth 2 (two) times daily as needed (BACK PAIN).   Yes [provider]  ibuprofen (ADVIL,MOTRIN) 800 MG tablet Take 1 tablet (800 mg total) by mouth 3 (three) times daily. Patient not taking: Reported on 10/04/2017 06/30/15   Danelle Berry, PA-C  oxyCODONE-acetaminophen (PERCOCET) 5-325 MG tablet Take 1-2 tablets by mouth every 4 (four) hours as needed for severe pain. Patient not taking: Reported on 10/04/2017 07/12/15   Kathryne Hitch, MD    Physical  Exam: Vitals:   10/04/17 0910 10/04/17 0911 10/04/17 1418  BP:  133/85 (!) 175/69  Pulse:  95 72  Resp:  18 16  Temp:  97.6 F (36.4 C)   TempSrc:  Oral   SpO2:  98% 99%  Weight: 83.9 kg (185 lb)    Height: 6' (1.829 m)        Constitutional: NAD, calm, comfortable Vitals:   10/04/17 0910 10/04/17 0911 10/04/17 1418  BP:  133/85 (!) 175/69  Pulse:  95 72  Resp:  18 16  Temp:  97.6 F (36.4 C)   TempSrc:  Oral   SpO2:  98% 99%  Weight: 83.9 kg (185 lb)    Height: 6' (1.829 m)     Eyes: PERRL, lids and conjunctivae normal ENMT: Mucous membranes are moist. Posterior pharynx clear of any exudate or lesions.Normal  dentition.  Neck: normal, supple, no masses, no thyromegaly Respiratory: clear to auscultation bilaterally, no wheezing, no crackles. Normal respiratory effort. No accessory muscle use.  Cardiovascular: Regular rate and rhythm, no murmurs / rubs / gallops. No extremity edema. 2+ pedal pulses. No carotid bruits.  Abdomen: no tenderness, no masses palpated. No hepatosplenomegaly. Bowel sounds positive.  Musculoskeletal: no clubbing / cyanosis. No joint deformity upper and lower extremities. Good ROM, no contractures. Normal muscle tone.  Tender to palpation in the lower back especially on the right side paraspinal region moving towards the right flank area. Skin: no rashes, lesions, ulcers. No induration Neurologic: CN 2-12 grossly intact. Sensation intact, DTR normal. Strength 5/5 in all 4.  Psychiatric: Normal judgment and insight. Alert and oriented x 3. Normal mood.     Labs on Admission: I have personally reviewed following labs and imaging studies  CBC: Recent Labs  Lab 10/04/17 1234  WBC 12.1*  HGB 15.2  HCT 44.7  MCV 68.8*  PLT 246   Basic Metabolic Panel: Recent Labs  Lab 10/04/17 1234  NA 136  K 4.1  CL 99*  CO2 23  GLUCOSE 116*  BUN 15  CREATININE 1.06  CALCIUM 9.3   GFR: Estimated Creatinine Clearance: 113.9 mL/min (by C-G formula based on SCr of 1.06 mg/dL). Liver Function Tests: Recent Labs  Lab 10/04/17 1234  AST 1,024*  ALT 476*  ALKPHOS 88  BILITOT 1.2  PROT 7.9  ALBUMIN 4.3   Recent Labs  Lab 10/04/17 1234  LIPASE 22   No results for input(s): AMMONIA in the last 168 hours. Coagulation Profile: No results for input(s): INR, PROTIME in the last 168 hours. Cardiac Enzymes: Recent Labs  Lab 10/04/17 1234  CKTOTAL >50,000*   BNP (last 3 results) No results for input(s): PROBNP in the last 8760 hours. HbA1C: No results for input(s): HGBA1C in the last 72 hours. CBG: No results for input(s): GLUCAP in the last 168 hours. Lipid  Profile: No results for input(s): CHOL, HDL, LDLCALC, TRIG, CHOLHDL, LDLDIRECT in the last 72 hours. Thyroid Function Tests: No results for input(s): TSH, T4TOTAL, FREET4, T3FREE, THYROIDAB in the last 72 hours. Anemia Panel: No results for input(s): VITAMINB12, FOLATE, FERRITIN, TIBC, IRON, RETICCTPCT in the last 72 hours. Urine analysis:    Component Value Date/Time   COLORURINE YELLOW 10/04/2017 1405   APPEARANCEUR CLEAR 10/04/2017 1405   LABSPEC 1.026 10/04/2017 1405   PHURINE 6.0 10/04/2017 1405   GLUCOSEU NEGATIVE 10/04/2017 1405   HGBUR LARGE (A) 10/04/2017 1405   BILIRUBINUR NEGATIVE 10/04/2017 1405   KETONESUR 20 (A) 10/04/2017 1405   PROTEINUR 100 (A) 10/04/2017  1405   NITRITE NEGATIVE 10/04/2017 1405   LEUKOCYTESUR NEGATIVE 10/04/2017 1405   Sepsis Labs: !!!!!!!!!!!!!!!!!!!!!!!!!!!!!!!!!!!!!!!!!!!! @LABRCNTIP (procalcitonin:4,lacticidven:4) )No results found for this or any previous visit (from the past 240 hour(s)).   Radiological Exams on Admission: Ct Abdomen Pelvis W Contrast  Result Date: 10/04/2017 CLINICAL DATA:  Nausea, vomiting. EXAM: CT ABDOMEN AND PELVIS WITH CONTRAST TECHNIQUE: Multidetector CT imaging of the abdomen and pelvis was performed using the standard protocol following bolus administration of intravenous contrast. CONTRAST:  ISOVUE-300 IOPAMIDOL (ISOVUE-300) INJECTION 61% COMPARISON:  None. FINDINGS: Lower chest: No acute abnormality. Hepatobiliary: No focal liver abnormality is seen. No gallstones, gallbladder wall thickening, or biliary dilatation. Pancreas: Unremarkable. No pancreatic ductal dilatation or surrounding inflammatory changes. Spleen: Normal in size without focal abnormality. Adrenals/Urinary Tract: Adrenal glands are unremarkable. Kidneys are normal, without renal calculi, focal lesion, or hydronephrosis. Bladder is unremarkable. Stomach/Bowel: Stomach is within normal limits. Appendix appears normal. No evidence of bowel wall  thickening, distention, or inflammatory changes. Vascular/Lymphatic: No significant vascular findings are present. No enlarged abdominal or pelvic lymph nodes. Reproductive: Prostate is unremarkable. Other: No abdominal wall hernia or abnormality. No abdominopelvic ascites. Musculoskeletal: No acute or significant osseous findings. IMPRESSION: No definite abnormality seen in the abdomen or pelvis. Electronically Signed   By: Lupita Raider, M.D.   On: 10/04/2017 14:51   Ct L-spine No Charge  Result Date: 10/04/2017 CLINICAL DATA:  Low back pain and spasms today.  No known injury. EXAM: CT LUMBAR SPINE WITHOUT CONTRAST TECHNIQUE: Multidetector CT imaging of the lumbar spine was performed without intravenous contrast administration. Multiplanar CT image reconstructions were also generated. COMPARISON:  CT abdomen and pelvis today. Plain films lumbar spine 08/08/2017. FINDINGS: Segmentation: The patient has transitional lumbosacral anatomy. Numbering scheme on this examination is based on the lowest visualized pair of ribs which are hypoplastic. There is sacralization of the lowest lumbar segment on the left. Alignment: Normal. Vertebrae: No fracture or focal lesion.  No pars defect. Paraspinal and other soft tissues: Negative. Disc levels: Disc space height is maintained at all levels. The central canal and foramina appear open. IMPRESSION: Negative lumbar spine. No finding to explain the patient's symptoms. Transitional lumbosacral anatomy incidentally noted. Electronically Signed   By: Drusilla Kanner M.D.   On: 10/04/2017 15:21    EKG: Independently reviewed.   Assessment/Plan Active Problems:   Rhabdomyolysis   Acute low back pain Severe rhabdomyolysis -Admit the patient for further care especially for aggressive IV fluid resuscitation -CK > 50,000 -We will place him on pain medications, Flexeril as needed -Aggressive IV fluid resuscitation-normal saline 200 cc/h -Monitor urine output -She  can use a urinal, place condom catheter if the pain is preventing him to move around much for urination -UDS is only positive for opioids, UA is positive for hemoglobin dipstick -CT abdomen pelvis negative - Renal function remained stable at this time  Mild nausea/one episode of nonbloody vomiting -This appears to improve with IV fluids.  CT abdomen pelvis negative for acute pathology - Currently getting IV fluids, will resume oral diet and fluid intake as well as tolerated  Transaminitis -AST: ALT >2, AST is 1024, ALT 476 - Likely in the setting of severe rhabdomyolysis.  Acute hepatitis panel ordered in the ER -We will trend these LFTs, Tylenol level is negative, alcohol level is normal -Patient denies any routine alcohol use.  Has not really taken Tylenol for his pain, he has taken a few doses of ibuprofen and Flexeril at home  DVT prophylaxis: Early ambulation Code Status: Full code Family Communication: Significant other at bedside  Disposition Plan: To be determined Consults called: None Admission status: Inpatient MedSurg   Tahjay Binion Joline Maxcy MD [facility] Pager [phone]  If 7PM-7AM, please contact night-coverage [email or web address] Password [facility]  10/04/2017, 4:33 PM

## 2017-10-04 NOTE — ED Provider Notes (Signed)
[facility]-EMERGENCY DEPT Provider Note   CSN: [id number] Arrival date & time: 10/04/17  0902     History   Chief Complaint Chief Complaint  Patient presents with  . Back Pain  . Emesis    HPI Randy Hamilton is a 27 y.o. male otherwise healthy here presenting with back pain, abdominal pain.  Patient states that about 3 days ago, he was doing some lifting and was feeling fine at that time.  Later that night, he began to have progressively worsening lower back pain.  He states that the pain is worse on the right side and is associated with some chills as well as vomiting.  Abdominal pain as well.  He states that his urine is darker than usual but denies any frank hematuria.  Denies any history of kidney stones or sick contacts or fevers at home.  Denies any numbness or weakness to the legs.   The history is provided by the patient.    Past Medical History:  Diagnosis Date  . Heart murmur    as a child     Patient Active Problem List   Diagnosis Date Noted  . Rupture of left patellar tendon 07/12/2015  . Patellar tendon rupture 07/12/2015    Past Surgical History:  Procedure Laterality Date  . epidermal cyst removed from testicle     . PATELLAR TENDON REPAIR Left 07/12/2015   Procedure: DIRECT PRIMARY REPAIR LEFT PATELLA TENDON;  Surgeon: Kathryne Hitch, MD;  Location: [facility];  Service: Orthopedics;  Laterality: Left;       Home Medications    Prior to Admission medications   Medication Sig Start Date End Date Taking? Authorizing Provider  cyclobenzaprine (FLEXERIL) 5 MG tablet Take 1 tablet (5 mg total) by mouth 2 (two) times daily as needed for muscle spasms. 08/09/15  Yes Charlynne Pander, MD  ibuprofen (ADVIL,MOTRIN) 200 MG tablet Take 800 mg by mouth 2 (two) times daily as needed (BACK PAIN).   Yes [provider]  ibuprofen (ADVIL,MOTRIN) 800 MG tablet Take 1 tablet (800 mg total) by mouth 3 (three) times daily. Patient  not taking: Reported on 10/04/2017 06/30/15   Danelle Berry, PA-C  oxyCODONE-acetaminophen (PERCOCET) 5-325 MG tablet Take 1-2 tablets by mouth every 4 (four) hours as needed for severe pain. Patient not taking: Reported on 10/04/2017 07/12/15   Kathryne Hitch, MD    Family History Family History  Problem Relation Age of Onset  . Hypertension Mother   . Cancer Mother   . Cancer Brother   . Cancer Other     Social History Social History   Tobacco Use  . Smoking status: Never Smoker  . Smokeless tobacco: Never Used  Substance Use Topics  . Alcohol use: Yes    Comment: occ  . Drug use: No     Allergies   Patient has no known allergies.   Review of Systems Review of Systems  Gastrointestinal: Positive for vomiting.  Musculoskeletal: Positive for back pain.  All other systems reviewed and are negative.    Physical Exam Updated Vital Signs BP (!) 175/69 (BP Location: Left Arm)   Pulse 72   Temp 97.6 F (36.4 C) (Oral)   Resp 16   Ht 6' (1.829 m)   Wt 83.9 kg (185 lb)   SpO2 99%   BMI 25.09 kg/m   Physical Exam  Constitutional: He is oriented to person, place, and time.  Uncomfortable   HENT:  Head: Normocephalic.  Mouth/Throat: Oropharynx is clear and moist.  Eyes: Conjunctivae and EOM are normal. Pupils are equal, round, and reactive to light.  Neck: Normal range of motion. Neck supple.  Cardiovascular: Normal rate, regular rhythm and normal heart sounds.  Pulmonary/Chest: Effort normal and breath sounds normal. No stridor. No respiratory distress. He has no wheezes.  Abdominal: Soft.  Mild epigastric tenderness. Mild R CVAT   Musculoskeletal: Normal range of motion. He exhibits no edema.  Mild R paralumbar vs CVAT   Neurological: He is alert and oriented to person, place, and time.  Skin: Skin is warm and dry.  Psychiatric: He has a normal mood and affect.  Nursing note and vitals reviewed.    ED Treatments / Results  Labs (all labs ordered  are listed, but only abnormal results are displayed) Labs Reviewed  COMPREHENSIVE METABOLIC PANEL - Abnormal; Notable for the following components:      Result Value   Chloride 99 (*)    Glucose, Bld 116 (*)    AST 1,024 (*)    ALT 476 (*)    All other components within normal limits  CBC - Abnormal; Notable for the following components:   WBC 12.1 (*)    RBC 6.50 (*)    MCV 68.8 (*)    MCH 23.4 (*)    All other components within normal limits  URINALYSIS, ROUTINE W REFLEX MICROSCOPIC - Abnormal; Notable for the following components:   Hgb urine dipstick LARGE (*)    Ketones, ur 20 (*)    Protein, ur 100 (*)    Bacteria, UA RARE (*)    Squamous Epithelial / LPF 0-5 (*)    All other components within normal limits  ACETAMINOPHEN LEVEL - Abnormal; Notable for the following components:   Acetaminophen (Tylenol), Serum <10 (*)    All other components within normal limits  RAPID URINE DRUG SCREEN, HOSP PERFORMED - Abnormal; Notable for the following components:   Opiates POSITIVE (*)    All other components within normal limits  LIPASE, BLOOD  ETHANOL  CK  HEPATITIS PANEL, ACUTE    EKG  EKG Interpretation None       Radiology Ct Abdomen Pelvis W Contrast  Result Date: 10/04/2017 CLINICAL DATA:  Nausea, vomiting. EXAM: CT ABDOMEN AND PELVIS WITH CONTRAST TECHNIQUE: Multidetector CT imaging of the abdomen and pelvis was performed using the standard protocol following bolus administration of intravenous contrast. CONTRAST:  ISOVUE-300 IOPAMIDOL (ISOVUE-300) INJECTION 61% COMPARISON:  None. FINDINGS: Lower chest: No acute abnormality. Hepatobiliary: No focal liver abnormality is seen. No gallstones, gallbladder wall thickening, or biliary dilatation. Pancreas: Unremarkable. No pancreatic ductal dilatation or surrounding inflammatory changes. Spleen: Normal in size without focal abnormality. Adrenals/Urinary Tract: Adrenal glands are unremarkable. Kidneys are normal, without  renal calculi, focal lesion, or hydronephrosis. Bladder is unremarkable. Stomach/Bowel: Stomach is within normal limits. Appendix appears normal. No evidence of bowel wall thickening, distention, or inflammatory changes. Vascular/Lymphatic: No significant vascular findings are present. No enlarged abdominal or pelvic lymph nodes. Reproductive: Prostate is unremarkable. Other: No abdominal wall hernia or abnormality. No abdominopelvic ascites. Musculoskeletal: No acute or significant osseous findings. IMPRESSION: No definite abnormality seen in the abdomen or pelvis. Electronically Signed   By: Lupita Raider, M.D.   On: 10/04/2017 14:51   Ct L-spine No Charge  Result Date: 10/04/2017 CLINICAL DATA:  Low back pain and spasms today.  No known injury. EXAM: CT LUMBAR SPINE WITHOUT CONTRAST TECHNIQUE: Multidetector CT imaging of the lumbar spine was  performed without intravenous contrast administration. Multiplanar CT image reconstructions were also generated. COMPARISON:  CT abdomen and pelvis today. Plain films lumbar spine 08/08/2017. FINDINGS: Segmentation: The patient has transitional lumbosacral anatomy. Numbering scheme on this examination is based on the lowest visualized pair of ribs which are hypoplastic. There is sacralization of the lowest lumbar segment on the left. Alignment: Normal. Vertebrae: No fracture or focal lesion.  No pars defect. Paraspinal and other soft tissues: Negative. Disc levels: Disc space height is maintained at all levels. The central canal and foramina appear open. IMPRESSION: Negative lumbar spine. No finding to explain the patient's symptoms. Transitional lumbosacral anatomy incidentally noted. Electronically Signed   By: Drusilla Kannerhomas  Dalessio M.D.   On: 10/04/2017 15:21    Procedures Procedures (including critical care time)   CRITICAL CARE Performed by: Richardean Canalavid H Amedeo Detweiler   Total critical care time: 40 minutes  Critical care time was exclusive of separately billable  procedures and treating other patients.  Critical care was necessary to treat or prevent imminent or life-threatening deterioration.  Critical care was time spent personally by me on the following activities: development of treatment plan with patient and/or surrogate as well as nursing, discussions with consultants, evaluation of patient's response to treatment, examination of patient, obtaining history from patient or surrogate, ordering and performing treatments and interventions, ordering and review of laboratory studies, ordering and review of radiographic studies, pulse oximetry and re-evaluation of patient's condition.   Medications Ordered in ED Medications  ondansetron (ZOFRAN-ODT) disintegrating tablet 4 mg (4 mg Oral Given 10/04/17 0913)  sodium chloride 0.9 % bolus 1,000 mL (0 mLs Intravenous Stopped 10/04/17 1427)  morphine 4 MG/ML injection 4 mg (4 mg Intravenous Given 10/04/17 1236)  sodium chloride 0.9 % bolus 1,000 mL (1,000 mLs Intravenous New Bag/Given 10/04/17 1427)  iopamidol (ISOVUE-300) 61 % injection (100 mLs  Contrast Given 10/04/17 1433)  morphine 4 MG/ML injection 4 mg (4 mg Intravenous Given 10/04/17 1449)     Initial Impression / Assessment and Plan / ED Course  I have reviewed the triage vital signs and the nursing notes.  Pertinent labs & imaging results that were available during my care of the patient were reviewed by me and considered in my medical decision making (see chart for details).     Randy Hamilton is a 27 y.o. male here with abdominal pain, R flank pain, back pain. Likely muscle strain vs rhabdo vs renal colic vs gastro. Will get labs, lipase, CT ab/pel, UA, CK level. Will hydrate and reassess.   3:43 PM CK too high and still diluting (> 50,000 per lab), AST 1000, ALT 476. ETOH and tylenol negative. Acute hepatitis panel sent. Given 2 L NS bolus. CT ab/pel showed no obvious hepatomegaly or gallstones. Hospitalist to admit for rhabdomyolysis.     Final Clinical Impressions(s) / ED Diagnoses   Final diagnoses:  Back pain    ED Discharge Orders    None       Charlynne PanderYao, Maan Zarcone Hsienta, MD 10/04/17 1544

## 2017-10-04 NOTE — ED Triage Notes (Signed)
Patient c/o back spasm that started on Friday. Reports has done lifting and moving recently. Vomiting also.

## 2017-10-04 NOTE — ED Notes (Signed)
ED TO INPATIENT HANDOFF REPORT  Name/Age/Gender Randy Hamilton 27 y.o. male  Code Status    Code Status Orders  (From admission, onward)        Start     Ordered   10/04/17 1631  Full code  Continuous     10/04/17 1631    Code Status History    Date Active Date Inactive Code Status Order ID Comments User Context   07/12/2015 18:31 07/13/2015 16:59 Full Code [id number]  Mcarthur Rossetti, MD Inpatient      Home/SNF/Other Home  Chief Complaint back pain/emesis  Level of Care/Admitting Diagnosis ED Disposition    ED Disposition Condition [place]: [facility] [[id number]]  Level of Care: Med-Surg [16]  Diagnosis: Rhabdomyolysis [728.88.ICD-9-CM]  Admitting Physician: Gerlean Ren [facility] [[id number]]  Attending Physician: Gerlean Ren [facility] [[id number]]  Estimated length of stay: past midnight tomorrow  Certification:: I certify this patient will need inpatient services for at least 2 midnights  PT Class (Do Not Modify): Inpatient [101]  PT Acc Code (Do Not Modify): Private [1]       Medical History Past Medical History:  Diagnosis Date  . Heart murmur    as a child     Allergies No Known Allergies  IV Location/Drains/Wounds Patient Lines/Drains/Airways Status   Active Line/Drains/Airways    Name:   Placement date:   Placement time:   Site:   Days:   Peripheral IV 10/04/17 Left Antecubital   10/04/17    1239    Antecubital   less than 1   Incision (Closed) 07/12/15 Knee Left   07/12/15    1602     815          Labs/Imaging Results for orders placed or performed during the hospital encounter of 10/04/17 (from the past 48 hour(s))  Lipase, blood     Status: None   Collection Time: 10/04/17 12:34 PM  Result Value Ref Range   Lipase 22 11 - 51 U/L    Comment: Performed at [facility], [place] [address], [place], [place] [address]  Comprehensive metabolic panel     Status: Abnormal   Collection  Time: 10/04/17 12:34 PM  Result Value Ref Range   Sodium 136 135 - 145 mmol/L   Potassium 4.1 3.5 - 5.1 mmol/L   Chloride 99 (L) 101 - 111 mmol/L   CO2 23 22 - 32 mmol/L   Glucose, Bld 116 (H) 65 - 99 mg/dL   BUN 15 6 - 20 mg/dL   Creatinine, Ser 1.06 0.61 - 1.24 mg/dL   Calcium 9.3 8.9 - 10.3 mg/dL   Total Protein 7.9 6.5 - 8.1 g/dL   Albumin 4.3 3.5 - 5.0 g/dL   AST 1,024 (H) 15 - 41 U/L   ALT 476 (H) 17 - 63 U/L   Alkaline Phosphatase 88 38 - 126 U/L   Total Bilirubin 1.2 0.3 - 1.2 mg/dL   GFR calc non Af Amer >60 >60 mL/min   GFR calc Af Amer >60 >60 mL/min    Comment: (NOTE) The eGFR has been calculated using the CKD EPI equation. This calculation has not been validated in all clinical situations. eGFR's persistently <60 mL/min signify possible Chronic Kidney Disease.    Anion gap 14 5 - 15    Comment: Performed at [facility], [place] [address], [place], [place] [address]  CBC     Status: Abnormal   Collection Time:  10/04/17 12:34 PM  Result Value Ref Range   WBC 12.1 (H) 4.0 - 10.5 K/uL   RBC 6.50 (H) 4.22 - 5.81 MIL/uL   Hemoglobin 15.2 13.0 - 17.0 g/dL   HCT 44.7 39.0 - 52.0 %   MCV 68.8 (L) 78.0 - 100.0 fL   MCH 23.4 (L) 26.0 - 34.0 pg   MCHC 34.0 30.0 - 36.0 g/dL   RDW 14.8 11.5 - 15.5 %   Platelets 246 150 - 400 K/uL    Comment: Performed at [facility], [place] [address] [place], [place] [address]  CK     Status: Abnormal   Collection Time: 10/04/17 12:34 PM  Result Value Ref Range   Total CK >50,000 (H) 49 - 397 U/L    Comment: RESULTS CONFIRMED BY MANUAL DILUTION Performed at [facility], [place] [address], [place], [place] [address]   Urinalysis, Routine w reflex microscopic     Status: Abnormal   Collection Time: 10/04/17  2:05 PM  Result Value Ref Range   Color, Urine YELLOW YELLOW   APPearance CLEAR CLEAR   Specific Gravity, Urine 1.026 1.005 - 1.030   pH 6.0 5.0 - 8.0   Glucose, UA NEGATIVE  NEGATIVE mg/dL   Hgb urine dipstick LARGE (A) NEGATIVE   Bilirubin Urine NEGATIVE NEGATIVE   Ketones, ur 20 (A) NEGATIVE mg/dL   Protein, ur 100 (A) NEGATIVE mg/dL   Nitrite NEGATIVE NEGATIVE   Leukocytes, UA NEGATIVE NEGATIVE   RBC / HPF 0-5 0 - 5 RBC/hpf   WBC, UA 0-5 0 - 5 WBC/hpf   Bacteria, UA RARE (A) NONE SEEN   Squamous Epithelial / LPF 0-5 (A) NONE SEEN   Mucus PRESENT     Comment: Performed at [facility], [place] [address], [place], [place] [address]  Ethanol     Status: None   Collection Time: 10/04/17  2:05 PM  Result Value Ref Range   Alcohol, Ethyl (B) <10 <10 mg/dL    Comment:        LOWEST DETECTABLE LIMIT FOR SERUM ALCOHOL IS 10 mg/dL FOR MEDICAL PURPOSES ONLY Performed at [facility], [place] [address], [place], [place] [address]   Acetaminophen level     Status: Abnormal   Collection Time: 10/04/17  2:05 PM  Result Value Ref Range   Acetaminophen (Tylenol), Serum <10 (L) 10 - 30 ug/mL    Comment:        THERAPEUTIC CONCENTRATIONS VARY SIGNIFICANTLY. A RANGE OF 10-30 ug/mL MAY BE AN EFFECTIVE CONCENTRATION FOR MANY PATIENTS. HOWEVER, SOME ARE BEST TREATED AT CONCENTRATIONS OUTSIDE THIS RANGE. ACETAMINOPHEN CONCENTRATIONS >150 ug/mL AT 4 HOURS AFTER INGESTION AND >50 ug/mL AT 12 HOURS AFTER INGESTION ARE OFTEN ASSOCIATED WITH TOXIC REACTIONS. Performed at [facility], [place] [address] [place], [place] [address]   Rapid urine drug screen (hospital performed)     Status: Abnormal   Collection Time: 10/04/17  2:05 PM  Result Value Ref Range   Opiates POSITIVE (A) NONE DETECTED   Cocaine NONE DETECTED NONE DETECTED   Benzodiazepines NONE DETECTED NONE DETECTED   Amphetamines NONE DETECTED NONE DETECTED   Tetrahydrocannabinol NONE DETECTED NONE DETECTED   Barbiturates NONE DETECTED NONE DETECTED    Comment: (NOTE) DRUG SCREEN FOR MEDICAL PURPOSES ONLY.  IF CONFIRMATION IS NEEDED FOR ANY PURPOSE,  NOTIFY LAB WITHIN 5 DAYS. LOWEST DETECTABLE LIMITS FOR URINE DRUG SCREEN Drug Class  Cutoff (ng/mL) Amphetamine and metabolites    1000 Barbiturate and metabolites    200 Benzodiazepine                 459 Tricyclics and metabolites     300 Opiates and metabolites        300 Cocaine and metabolites        300 THC                            50 Performed at [facility], [place] [address] [place],  [address]    Ct Abdomen Pelvis W Contrast  Result Date: 10/04/2017 CLINICAL DATA:  Nausea, vomiting. EXAM: CT ABDOMEN AND PELVIS WITH CONTRAST TECHNIQUE: Multidetector CT imaging of the abdomen and pelvis was performed using the standard protocol following bolus administration of intravenous contrast. CONTRAST:  134m ISOVUE-300 IOPAMIDOL (ISOVUE-300) INJECTION 61% COMPARISON:  None. FINDINGS: Lower chest: No acute abnormality. Hepatobiliary: No focal liver abnormality is seen. No gallstones, gallbladder wall thickening, or biliary dilatation. Pancreas: Unremarkable. No pancreatic ductal dilatation or surrounding inflammatory changes. Spleen: Normal in size without focal abnormality. Adrenals/Urinary Tract: Adrenal glands are unremarkable. Kidneys are normal, without renal calculi, focal lesion, or hydronephrosis. Bladder is unremarkable. Stomach/Bowel: Stomach is within normal limits. Appendix appears normal. No evidence of bowel wall thickening, distention, or inflammatory changes. Vascular/Lymphatic: No significant vascular findings are present. No enlarged abdominal or pelvic lymph nodes. Reproductive: Prostate is unremarkable. Other: No abdominal wall hernia or abnormality. No abdominopelvic ascites. Musculoskeletal: No acute or significant osseous findings. IMPRESSION: No definite abnormality seen in the abdomen or pelvis. Electronically Signed   By: JMarijo Conception M.D.   On: 10/04/2017 14:51   Ct L-spine No Charge  Result Date: 10/04/2017 CLINICAL  DATA:  Low back pain and spasms today.  No known injury. EXAM: CT LUMBAR SPINE WITHOUT CONTRAST TECHNIQUE: Multidetector CT imaging of the lumbar spine was performed without intravenous contrast administration. Multiplanar CT image reconstructions were also generated. COMPARISON:  CT abdomen and pelvis today. Plain films lumbar spine 08/08/2017. FINDINGS: Segmentation: The patient has transitional lumbosacral anatomy. Numbering scheme on this examination is based on the lowest visualized pair of ribs which are hypoplastic. There is sacralization of the lowest lumbar segment on the left. Alignment: Normal. Vertebrae: No fracture or focal lesion.  No pars defect. Paraspinal and other soft tissues: Negative. Disc levels: Disc space height is maintained at all levels. The central canal and foramina appear open. IMPRESSION: Negative lumbar spine. No finding to explain the patient's symptoms. Transitional lumbosacral anatomy incidentally noted. Electronically Signed   By: TInge RiseM.D.   On: 10/04/2017 15:21    Pending Labs Unresulted Labs (From admission, onward)   Start     Ordered   10/05/17 0500  Comprehensive metabolic panel  Daily,   R     10/04/17 1631   10/05/17 0500  CK  Daily,   R     10/04/17 1631   10/04/17 1630  HIV antibody (Routine Testing)  Once,   R     10/04/17 1631   10/04/17 1341  Hepatitis panel, acute  STAT,   STAT     10/04/17 1340      Vitals/Pain Today's Vitals   10/04/17 1640 10/04/17 1901 10/04/17 1911 10/04/17 1936  BP: (!) 144/86  (!) 151/77   Pulse: 76  88   Resp: 18  18   Temp:  TempSrc:      SpO2: 99%  98%   Weight:      Height:      PainSc:  5   6     Isolation Precautions No active isolations  Medications Medications  cyclobenzaprine (FLEXERIL) tablet 5 mg (not administered)  ibuprofen (ADVIL,MOTRIN) tablet 800 mg (not administered)  0.9 %  sodium chloride infusion ( Intravenous New Bag/Given 10/04/17 1835)  acetaminophen (TYLENOL) tablet  650 mg (not administered)    Or  acetaminophen (TYLENOL) suppository 650 mg (not administered)  HYDROcodone-acetaminophen (NORCO/VICODIN) 5-325 MG per tablet 1-2 tablet (not administered)  ketorolac (TORADOL) 30 MG/ML injection 30 mg (30 mg Intravenous Given 10/04/17 1828)  senna-docusate (Senokot-S) tablet 1 tablet (not administered)  ondansetron (ZOFRAN-ODT) disintegrating tablet 4 mg (4 mg Oral Given 10/04/17 0913)  sodium chloride 0.9 % bolus 1,000 mL (0 mLs Intravenous Stopped 10/04/17 1427)  morphine 4 MG/ML injection 4 mg (4 mg Intravenous Given 10/04/17 1236)  sodium chloride 0.9 % bolus 1,000 mL (1,000 mLs Intravenous New Bag/Given 10/04/17 1427)  iopamidol (ISOVUE-300) 61 % injection (100 mLs  Contrast Given 10/04/17 1433)  morphine 4 MG/ML injection 4 mg (4 mg Intravenous Given 10/04/17 1449)    Mobility walks

## 2017-10-05 LAB — COMPREHENSIVE METABOLIC PANEL
ALBUMIN: 3.3 g/dL — AB (ref 3.5–5.0)
ALT: 392 U/L — ABNORMAL HIGH (ref 17–63)
ANION GAP: 9 (ref 5–15)
AST: 713 U/L — ABNORMAL HIGH (ref 15–41)
Alkaline Phosphatase: 69 U/L (ref 38–126)
BILIRUBIN TOTAL: 1.5 mg/dL — AB (ref 0.3–1.2)
BUN: 12 mg/dL (ref 6–20)
CHLORIDE: 104 mmol/L (ref 101–111)
CO2: 23 mmol/L (ref 22–32)
Calcium: 8.3 mg/dL — ABNORMAL LOW (ref 8.9–10.3)
Creatinine, Ser: 0.96 mg/dL (ref 0.61–1.24)
GFR calc Af Amer: >60 mL/min (ref >60)
GFR calc non Af Amer: >60 mL/min (ref >60)
GLUCOSE: 113 mg/dL — AB (ref 65–99)
POTASSIUM: 4 mmol/L (ref 3.5–5.1)
SODIUM: 136 mmol/L (ref 135–145)
Total Protein: 6.6 g/dL (ref 6.5–8.1)

## 2017-10-05 LAB — HEPATITIS PANEL, ACUTE
HCV Ab: <0.1 s/co ratio (ref 0.0–0.9)
HEP B C IGM: NEGATIVE
HEP B S AG: NEGATIVE
Hep A IgM: NEGATIVE

## 2017-10-05 LAB — CK: Total CK: 49558 U/L — ABNORMAL HIGH (ref 49–397)

## 2017-10-05 MED ORDER — SODIUM CHLORIDE 0.9 % IV SOLN
INTRAVENOUS | Status: AC
  Administered 2017-10-05 – 2017-10-06 (×2): via INTRAVENOUS

## 2017-10-05 MED ORDER — POLYETHYLENE GLYCOL 3350 17 G PO PACK
17.0000 g | PACK | Freq: Every day | ORAL | Status: DC
  Administered 2017-10-05 – 2017-10-09 (×4): 17 g via ORAL
  Filled 2017-10-05 (×2): qty 1

## 2017-10-05 NOTE — Progress Notes (Signed)
[facility] PROGRESS NOTE    Progress Note  Randy Hamilton  [id number] DOB: [birth year] DOA: 10/04/2017 PCP: Patient, No Pcp Per     Brief Narrative:   Randy Rightersaiah D Hamilton is an 27 y.o. male without significant past medical history who was lifting weights and started developing back pain, in the ER CT scan of the abdomen and pelvis was negative, UDS was positive for opiates which she has been getting urgent care CK was checked which was greater than 50,000  Assessment/Plan:   Active Problems:   Non-traumatic rhabdomyolysis Repeated CK now after IV fluid hydration is 49,000. Cont. aggressive IV fluids for an additional 24 hours have encouraged him to continue oral intake. Renal function continue be stable at this time. Recheck a CK in the morning.  DVT prophylaxis: none Family Communication:girlfriend Disposition Plan/Barrier to D/C: unable to determine Code Status:     Code Status Orders  (From admission, onward)        Start     Ordered   10/04/17 1631  Full code  Continuous     10/04/17 1631    Code Status History    Date Active Date Inactive Code Status Order ID Comments User Context   07/12/2015 18:31 07/13/2015 16:59 Full Code [id number]  Kathryne HitchBlackman, Christopher Y, MD Inpatient        IV Access:    Peripheral IV   Procedures and diagnostic studies:   Ct Abdomen Pelvis W Contrast  Result Date: 10/04/2017 CLINICAL DATA:  Nausea, vomiting. EXAM: CT ABDOMEN AND PELVIS WITH CONTRAST TECHNIQUE: Multidetector CT imaging of the abdomen and pelvis was performed using the standard protocol following bolus administration of intravenous contrast. CONTRAST:  100mL ISOVUE-300 IOPAMIDOL (ISOVUE-300) INJECTION 61% COMPARISON:  None. FINDINGS: Lower chest: No acute abnormality. Hepatobiliary: No focal liver abnormality is seen. No gallstones, gallbladder wall thickening, or biliary dilatation. Pancreas: Unremarkable. No pancreatic ductal dilatation or surrounding  inflammatory changes. Spleen: Normal in size without focal abnormality. Adrenals/Urinary Tract: Adrenal glands are unremarkable. Kidneys are normal, without renal calculi, focal lesion, or hydronephrosis. Bladder is unremarkable. Stomach/Bowel: Stomach is within normal limits. Appendix appears normal. No evidence of bowel wall thickening, distention, or inflammatory changes. Vascular/Lymphatic: No significant vascular findings are present. No enlarged abdominal or pelvic lymph nodes. Reproductive: Prostate is unremarkable. Other: No abdominal wall hernia or abnormality. No abdominopelvic ascites. Musculoskeletal: No acute or significant osseous findings. IMPRESSION: No definite abnormality seen in the abdomen or pelvis. Electronically Signed   By: Lupita RaiderJames  Green Jr, M.D.   On: 10/04/2017 14:51   Ct L-spine No Charge  Result Date: 10/04/2017 CLINICAL DATA:  Low back pain and spasms today.  No known injury. EXAM: CT LUMBAR SPINE WITHOUT CONTRAST TECHNIQUE: Multidetector CT imaging of the lumbar spine was performed without intravenous contrast administration. Multiplanar CT image reconstructions were also generated. COMPARISON:  CT abdomen and pelvis today. Plain films lumbar spine 08/08/2017. FINDINGS: Segmentation: The patient has transitional lumbosacral anatomy. Numbering scheme on this examination is based on the lowest visualized pair of ribs which are hypoplastic. There is sacralization of the lowest lumbar segment on the left. Alignment: Normal. Vertebrae: No fracture or focal lesion.  No pars defect. Paraspinal and other soft tissues: Negative. Disc levels: Disc space height is maintained at all levels. The central canal and foramina appear open. IMPRESSION: Negative lumbar spine. No finding to explain the patient's symptoms. Transitional lumbosacral anatomy incidentally noted. Electronically Signed   By: Drusilla Kannerhomas  Dalessio M.D.   On: 10/04/2017 15:21  Medical Consultants:     None.  Anti-Infectives:   None  Subjective:    Randy Righter no complaint but still having back pain.  Objective:    Vitals:   10/04/17 1640 10/04/17 1911 10/04/17 2038 10/05/17 0422  BP: (!) 144/86 (!) 151/77 (!) 142/73 (!) 155/70  Pulse: 76 88 72 71  Resp: 18 18 16 18   Temp:   98.6 F (37 C) 99 F (37.2 C)  TempSrc:   Oral Oral  SpO2: 99% 98% 100% 98%  Weight:   83.7 kg (184 lb 9.6 oz) 83.5 kg (184 lb 1.4 oz)  Height:   6' (1.829 m)     Intake/Output Summary (Last 24 hours) at 10/05/2017 0820 Last data filed at 10/05/2017 0600 Gross per 24 hour  Intake 2523.33 ml  Output 425 ml  Net 2098.33 ml   Filed Weights   10/04/17 0910 10/04/17 2038 10/05/17 0422  Weight: 83.9 kg (185 lb) 83.7 kg (184 lb 9.6 oz) 83.5 kg (184 lb 1.4 oz)    Exam: General exam: In no acute distress. Respiratory system: Good air movement and clear to auscultation. Cardiovascular system: S1 & S2 heard, RRR. Gastrointestinal system: Abdomen is nondistended, soft and nontender.  Central nervous system: Alert and oriented. No focal neurological deficits. Extremities: No pedal edema. Skin: No rashes, lesions or ulcers    Data Reviewed:    Labs: Basic Metabolic Panel: Recent Labs  Lab 10/04/17 1234 10/05/17 0421  NA 136 136  K 4.1 4.0  CL 99* 104  CO2 23 23  GLUCOSE 116* 113*  BUN 15 12  CREATININE 1.06 0.96  CALCIUM 9.3 8.3*   GFR Estimated Creatinine Clearance: 125.7 mL/min (by C-G formula based on SCr of 0.96 mg/dL). Liver Function Tests: Recent Labs  Lab 10/04/17 1234 10/05/17 0421  AST 1,024* 713*  ALT 476* 392*  ALKPHOS 88 69  BILITOT 1.2 1.5*  PROT 7.9 6.6  ALBUMIN 4.3 3.3*   Recent Labs  Lab 10/04/17 1234  LIPASE 22   No results for input(s): AMMONIA in the last 168 hours. Coagulation profile No results for input(s): INR, PROTIME in the last 168 hours.  CBC: Recent Labs  Lab 10/04/17 1234  WBC 12.1*  HGB 15.2  HCT 44.7  MCV 68.8*  PLT 246    Cardiac Enzymes: Recent Labs  Lab 10/04/17 1234 10/05/17 0421  CKTOTAL >50,000* 49,558*   BNP (last 3 results) No results for input(s): PROBNP in the last 8760 hours. CBG: No results for input(s): GLUCAP in the last 168 hours. D-Dimer: No results for input(s): DDIMER in the last 72 hours. Hgb A1c: No results for input(s): HGBA1C in the last 72 hours. Lipid Profile: No results for input(s): CHOL, HDL, LDLCALC, TRIG, CHOLHDL, LDLDIRECT in the last 72 hours. Thyroid function studies: No results for input(s): TSH, T4TOTAL, T3FREE, THYROIDAB in the last 72 hours.  Invalid input(s): FREET3 Anemia work up: No results for input(s): VITAMINB12, FOLATE, FERRITIN, TIBC, IRON, RETICCTPCT in the last 72 hours. Sepsis Labs: Recent Labs  Lab 10/04/17 1234  WBC 12.1*   Microbiology No results found for this or any previous visit (from the past 240 hour(s)).   Medications:    Continuous Infusions: . sodium chloride 200 mL/hr at 10/05/17 0105      LOS: 1 day   Marinda Elk  [facility] Pager [phone]  *Please refer to [email or web address], password [email or web address] to get updated schedule on who will round on this patient, as hospitalists switch teams  weekly. If 7PM-7AM, please contact night-coverage at [email or web address], password [email or web address] for any overnight needs.  10/05/2017, 8:20 AM

## 2017-10-06 LAB — COMPREHENSIVE METABOLIC PANEL
ALBUMIN: 2.9 g/dL — AB (ref 3.5–5.0)
ALT: 336 U/L — ABNORMAL HIGH (ref 17–63)
ANION GAP: 8 (ref 5–15)
AST: 520 U/L — ABNORMAL HIGH (ref 15–41)
Alkaline Phosphatase: 61 U/L (ref 38–126)
BUN: 8 mg/dL (ref 6–20)
CO2: 23 mmol/L (ref 22–32)
Calcium: 8.1 mg/dL — ABNORMAL LOW (ref 8.9–10.3)
Chloride: 106 mmol/L (ref 101–111)
Creatinine, Ser: 0.82 mg/dL (ref 0.61–1.24)
GFR calc Af Amer: >60 mL/min (ref >60)
GFR calc non Af Amer: >60 mL/min (ref >60)
GLUCOSE: 100 mg/dL — AB (ref 65–99)
Potassium: 3.5 mmol/L (ref 3.5–5.1)
SODIUM: 137 mmol/L (ref 135–145)
TOTAL PROTEIN: 6 g/dL — AB (ref 6.5–8.1)
Total Bilirubin: 1.2 mg/dL (ref 0.3–1.2)

## 2017-10-06 LAB — HIV ANTIBODY (ROUTINE TESTING W REFLEX): HIV Screen 4th Generation wRfx: NONREACTIVE

## 2017-10-06 LAB — CK: Total CK: 29890 U/L — ABNORMAL HIGH (ref 49–397)

## 2017-10-06 MED ORDER — BISACODYL 10 MG RE SUPP: 10.0000 mg | Freq: Once | RECTAL | Status: DC

## 2017-10-06 MED ORDER — SODIUM CHLORIDE 0.9 % IV SOLN
INTRAVENOUS | Status: DC
  Administered 2017-10-06 – 2017-10-09 (×11): via INTRAVENOUS

## 2017-10-06 NOTE — Progress Notes (Signed)
PROGRESS NOTE    Randy Hamilton  UJW:[id number] DOB: 1990-11-10 DOA: 10/04/2017 PCP: Patient, No Pcp Per   Brief Narrative: Patient is a 27 year old male  no significant past medical history who presented to the emergency department with complaint of severe back pain, generalized body ache.  Patient reported to be lifting weights.  Patient was found to have severe elevation of his creatinine kinase.  Assessment & Plan:   Active Problems:   Non-traumatic rhabdomyolysis  Rhabdomyolysis: Triggered by weightlifting.  Presented with severe back pain, generalized body ache. Overall condition improving.  CK is trending down. Continue aggressive IV fluids. Kidney function stable at this time. We will continue to monitor CK Patient encouraged to ambulate.  Also encouraged for increasing  oral intake.    DVT prophylaxis: None Code Status: Full Family Communication: Girlfriend at bed side Disposition Plan: Home in 1-2 days   Consultants: None  Procedures:None  Antimicrobials:None  Subjective: Patient seen and examined the bedside this morning.  Remains comfortable.  He still complains of mild back pain, generalized body ache.  Objective: Vitals:   10/05/17 1250 10/05/17 2131 10/06/17 0546 10/06/17 1354  BP: (!) 144/69 (!) 146/58 (!) 151/70 138/64  Pulse: 73 80 84 81  Resp:  18 18 18   Temp: 100.1 F (37.8 C) 99.3 F (37.4 C) 98.8 F (37.1 C) (!) 100.6 F (38.1 C)  TempSrc: Oral Oral Oral Oral  SpO2: 100% 99% 100% 100%  Weight:   86.4 kg (190 lb 7.6 oz)   Height:        Intake/Output Summary (Last 24 hours) at 10/06/2017 1452 Last data filed at 10/06/2017 1441 Gross per 24 hour  Intake 2790 ml  Output 2200 ml  Net 590 ml   Filed Weights   10/04/17 2038 10/05/17 0422 10/06/17 0546  Weight: 83.7 kg (184 lb 9.6 oz) 83.5 kg (184 lb 1.4 oz) 86.4 kg (190 lb 7.6 oz)    Examination:  General exam: Appears calm and comfortable ,Not in distress,average  built HEENT:PERRL,Oral mucosa moist, Ear/Nose normal on gross exam Respiratory system: Bilateral equal air entry, normal vesicular breath sounds, no wheezes or crackles  Cardiovascular system: S1 & S2 heard, RRR. No JVD, murmurs, rubs, gallops or clicks. No pedal edema. Gastrointestinal system: Abdomen is nondistended, soft and nontender. No organomegaly or masses felt. Normal bowel sounds heard. Central nervous system: Alert and oriented. No focal neurological deficits. Extremities: No edema, no clubbing ,no cyanosis, distal peripheral pulses palpable. Skin: No rashes, lesions or ulcers,no icterus ,no pallor MSK: Normal muscle bulk,tone ,power Psychiatry: Judgement and insight appear normal. Mood & affect appropriate.     Data Reviewed: I have personally reviewed following labs and imaging studies  CBC: Recent Labs  Lab 10/04/17 1234  WBC 12.1*  HGB 15.2  HCT 44.7  MCV 68.8*  PLT 246   Basic Metabolic Panel: Recent Labs  Lab 10/04/17 1234 10/05/17 0421 10/06/17 0434  NA 136 136 137  K 4.1 4.0 3.5  CL 99* 104 106  CO2 23 23 23   GLUCOSE 116* 113* 100*  BUN 15 12 8   CREATININE 1.06 0.96 0.82  CALCIUM 9.3 8.3* 8.1*   GFR: Estimated Creatinine Clearance: 147.2 mL/min (by C-G formula based on SCr of 0.82 mg/dL). Liver Function Tests: Recent Labs  Lab 10/04/17 1234 10/05/17 0421 10/06/17 0434  AST 1,024* 713* 520*  ALT 476* 392* 336*  ALKPHOS 88 69 61  BILITOT 1.2 1.5* 1.2  PROT 7.9 6.6 6.0*  ALBUMIN 4.3 3.3*  2.9*   Recent Labs  Lab 10/04/17 1234  LIPASE 22   No results for input(s): AMMONIA in the last 168 hours. Coagulation Profile: No results for input(s): INR, PROTIME in the last 168 hours. Cardiac Enzymes: Recent Labs  Lab 10/04/17 1234 10/05/17 0421 10/06/17 0434  CKTOTAL >50,000* 49,558* 29,890*   BNP (last 3 results) No results for input(s): PROBNP in the last 8760 hours. HbA1C: No results for input(s): HGBA1C in the last 72 hours. CBG: No  results for input(s): GLUCAP in the last 168 hours. Lipid Profile: No results for input(s): CHOL, HDL, LDLCALC, TRIG, CHOLHDL, LDLDIRECT in the last 72 hours. Thyroid Function Tests: No results for input(s): TSH, T4TOTAL, FREET4, T3FREE, THYROIDAB in the last 72 hours. Anemia Panel: No results for input(s): VITAMINB12, FOLATE, FERRITIN, TIBC, IRON, RETICCTPCT in the last 72 hours. Sepsis Labs: No results for input(s): PROCALCITON, LATICACIDVEN in the last 168 hours.  No results found for this or any previous visit (from the past 240 hour(s)).       Radiology Studies: Ct L-spine No Charge  Result Date: 10/04/2017 CLINICAL DATA:  Low back pain and spasms today.  No known injury. EXAM: CT LUMBAR SPINE WITHOUT CONTRAST TECHNIQUE: Multidetector CT imaging of the lumbar spine was performed without intravenous contrast administration. Multiplanar CT image reconstructions were also generated. COMPARISON:  CT abdomen and pelvis today. Plain films lumbar spine 08/08/2017. FINDINGS: Segmentation: The patient has transitional lumbosacral anatomy. Numbering scheme on this examination is based on the lowest visualized pair of ribs which are hypoplastic. There is sacralization of the lowest lumbar segment on the left. Alignment: Normal. Vertebrae: No fracture or focal lesion.  No pars defect. Paraspinal and other soft tissues: Negative. Disc levels: Disc space height is maintained at all levels. The central canal and foramina appear open. IMPRESSION: Negative lumbar spine. No finding to explain the patient's symptoms. Transitional lumbosacral anatomy incidentally noted. Electronically Signed   By: Drusilla Kannerhomas  Dalessio M.D.   On: 10/04/2017 15:21        Scheduled Meds: . bisacodyl  10 mg Rectal Once  . polyethylene glycol  17 g Oral Daily   Continuous Infusions: . sodium chloride 200 mL/hr at 10/06/17 1220     LOS: 2 days    Time spent: More than 50% of that time was spent in counseling and/or  coordination of care.      Meredith LeedsAmrit Shavonta Gossen BK, MD [facility] Pager [phone]  If 7PM-7AM, please contact night-coverage [email or web address] Password [email or web address] 10/06/2017, 2:52 PM

## 2017-10-07 LAB — COMPREHENSIVE METABOLIC PANEL
ALT: 300 U/L — AB (ref 17–63)
AST: 395 U/L — ABNORMAL HIGH (ref 15–41)
Albumin: 2.8 g/dL — ABNORMAL LOW (ref 3.5–5.0)
Alkaline Phosphatase: 58 U/L (ref 38–126)
Anion gap: 9 (ref 5–15)
BUN: 7 mg/dL (ref 6–20)
CHLORIDE: 108 mmol/L (ref 101–111)
CO2: 23 mmol/L (ref 22–32)
CREATININE: 0.76 mg/dL (ref 0.61–1.24)
Calcium: 8.3 mg/dL — ABNORMAL LOW (ref 8.9–10.3)
Glucose, Bld: 95 mg/dL (ref 65–99)
Potassium: 3.8 mmol/L (ref 3.5–5.1)
SODIUM: 140 mmol/L (ref 135–145)
Total Bilirubin: 1.1 mg/dL (ref 0.3–1.2)
Total Protein: 5.9 g/dL — ABNORMAL LOW (ref 6.5–8.1)

## 2017-10-07 LAB — CK: Total CK: 18474 U/L — ABNORMAL HIGH (ref 49–397)

## 2017-10-07 NOTE — Progress Notes (Signed)
Pt follow up appointment March 1 at 1:00PM/[facility].

## 2017-10-07 NOTE — Progress Notes (Signed)
PROGRESS NOTE    KLEBER CREAN  ZOX:[id number] DOB: 07-26-1991 DOA: 10/04/2017 PCP: Patient, No Pcp Per   Brief Narrative: Patient is a 27 year old male  no significant past medical history who presented to the emergency department with complaint of severe back pain, generalized body ache.  Patient reported to be lifting weights.  Patient was found to have severe elevation of his creatinine kinase.  Admitted for aggressive IV fluid administration.  Assessment & Plan:   Active Problems:   Non-traumatic rhabdomyolysis  Rhabdomyolysis: Triggered by weightlifting.  Presented with severe back pain, generalized body ache. Overall condition improving.  CK is trending down. Continue aggressive IV fluids. Kidney function stable at this time. We will continue to monitor CK Patient encouraged to ambulate.  Also encouraged for increasing  oral intake.    DVT prophylaxis: None.Patient ambulating Code Status: Full Family Communication: Girlfriend at bed side Disposition Plan: Home in 1-2 days.  Can likely discharge him home when his CK comes to around 5000   Consultants: None  Procedures:None  Antimicrobials:None  Subjective: Patient seen and examined the bedside this morning.  Remains comfortable.  Very eager to go home.  Objective: Vitals:   10/06/17 2345 10/07/17 0130 10/07/17 0608 10/07/17 1312  BP:   (!) 142/67 130/66  Pulse:   63 68  Resp:   20   Temp: 100.2 F (37.9 C) 98.4 F (36.9 C) 99.4 F (37.4 C) 99.5 F (37.5 C)  TempSrc: Oral Oral Oral Oral  SpO2:   100% 100%  Weight:   86.9 kg (191 lb 9.6 oz)   Height:        Intake/Output Summary (Last 24 hours) at 10/07/2017 1610 Last data filed at 10/07/2017 1409 Gross per 24 hour  Intake 3470 ml  Output 3410 ml  Net 60 ml   Filed Weights   10/05/17 0422 10/06/17 0546 10/07/17 8119  Weight: 83.5 kg (184 lb 1.4 oz) 86.4 kg (190 lb 7.6 oz) 86.9 kg (191 lb 9.6 oz)    Examination:  General exam: Appears calm and  comfortable ,Not in distress,average built HEENT:PERRL,Oral mucosa moist, Ear/Nose normal on gross exam Respiratory system: Bilateral equal air entry, normal vesicular breath sounds, no wheezes or crackles  Cardiovascular system: S1 & S2 heard, RRR. No JVD, murmurs, rubs, gallops or clicks. Gastrointestinal system: Abdomen is nondistended, soft and nontender. No organomegaly or masses felt. Normal bowel sounds heard. Central nervous system: Alert and oriented. No focal neurological deficits. Extremities: No edema, no clubbing ,no cyanosis, distal peripheral pulses palpable. Skin: No rashes, lesions or ulcers,no icterus ,no pallor MSK: Normal muscle bulk,tone ,power Psychiatry: Judgement and insight appear normal. Mood & affect appropriate.      Data Reviewed: I have personally reviewed following labs and imaging studies  CBC: Recent Labs  Lab 10/04/17 1234  WBC 12.1*  HGB 15.2  HCT 44.7  MCV 68.8*  PLT 246   Basic Metabolic Panel: Recent Labs  Lab 10/04/17 1234 10/05/17 0421 10/06/17 0434 10/07/17 0421  NA 136 136 137 140  K 4.1 4.0 3.5 3.8  CL 99* 104 106 108  CO2 23 23 23 23   GLUCOSE 116* 113* 100* 95  BUN 15 12 8 7   CREATININE 1.06 0.96 0.82 0.76  CALCIUM 9.3 8.3* 8.1* 8.3*   GFR: Estimated Creatinine Clearance: 150.9 mL/min (by C-G formula based on SCr of 0.76 mg/dL). Liver Function Tests: Recent Labs  Lab 10/04/17 1234 10/05/17 0421 10/06/17 0434 10/07/17 0421  AST 1,024* 713* 520* 395*  ALT 476* 392* 336* 300*  ALKPHOS 88 69 61 58  BILITOT 1.2 1.5* 1.2 1.1  PROT 7.9 6.6 6.0* 5.9*  ALBUMIN 4.3 3.3* 2.9* 2.8*   Recent Labs  Lab 10/04/17 1234  LIPASE 22   No results for input(s): AMMONIA in the last 168 hours. Coagulation Profile: No results for input(s): INR, PROTIME in the last 168 hours. Cardiac Enzymes: Recent Labs  Lab 10/04/17 1234 10/05/17 0421 10/06/17 0434 10/07/17 0421  CKTOTAL >50,000* 49,558* 29,890* 18,474*   BNP (last 3  results) No results for input(s): PROBNP in the last 8760 hours. HbA1C: No results for input(s): HGBA1C in the last 72 hours. CBG: No results for input(s): GLUCAP in the last 168 hours. Lipid Profile: No results for input(s): CHOL, HDL, LDLCALC, TRIG, CHOLHDL, LDLDIRECT in the last 72 hours. Thyroid Function Tests: No results for input(s): TSH, T4TOTAL, FREET4, T3FREE, THYROIDAB in the last 72 hours. Anemia Panel: No results for input(s): VITAMINB12, FOLATE, FERRITIN, TIBC, IRON, RETICCTPCT in the last 72 hours. Sepsis Labs: No results for input(s): PROCALCITON, LATICACIDVEN in the last 168 hours.  No results found for this or any previous visit (from the past 240 hour(s)).       Radiology Studies: No results found.      Scheduled Meds: . bisacodyl  10 mg Rectal Once  . polyethylene glycol  17 g Oral Daily   Continuous Infusions: . sodium chloride 200 mL/hr at 10/07/17 1226     LOS: 3 days    Time spent:25 minutes. More than 50% of that time was spent in counseling and/or coordination of care.      Meredith LeedsAmrit Sherwin Hollingshed BK, MD [facility] Pager [phone]  If 7PM-7AM, please contact night-coverage [email or web address] Password [email or web address] 10/07/2017, 4:10 PM

## 2017-10-08 DIAGNOSIS — R945 Abnormal results of liver function studies: Secondary | ICD-10-CM

## 2017-10-08 LAB — COMPREHENSIVE METABOLIC PANEL
ALK PHOS: 74 U/L (ref 38–126)
ALT: 319 U/L — AB (ref 17–63)
ALT: 322 U/L — ABNORMAL HIGH (ref 17–63)
ANION GAP: 11 (ref 5–15)
AST: 311 U/L — AB (ref 15–41)
AST: 310 U/L — ABNORMAL HIGH (ref 15–41)
Albumin: 3.2 g/dL — ABNORMAL LOW (ref 3.5–5.0)
Albumin: 3.3 g/dL — ABNORMAL LOW (ref 3.5–5.0)
Alkaline Phosphatase: 75 U/L (ref 38–126)
Anion gap: 12 (ref 5–15)
BUN: 8 mg/dL (ref 6–20)
BUN: 8 mg/dL (ref 6–20)
CALCIUM: 8.9 mg/dL (ref 8.9–10.3)
CHLORIDE: 103 mmol/L (ref 101–111)
CO2: 24 mmol/L (ref 22–32)
CO2: 24 mmol/L (ref 22–32)
CREATININE: 0.96 mg/dL (ref 0.61–1.24)
Calcium: 8.9 mg/dL (ref 8.9–10.3)
Chloride: 103 mmol/L (ref 101–111)
Creatinine, Ser: 0.8 mg/dL (ref 0.61–1.24)
GFR calc non Af Amer: >60 mL/min (ref >60)
GFR calc non Af Amer: >60 mL/min (ref >60)
Glucose, Bld: 92 mg/dL (ref 65–99)
Glucose, Bld: 96 mg/dL (ref 65–99)
Potassium: 4 mmol/L (ref 3.5–5.1)
Potassium: 4.1 mmol/L (ref 3.5–5.1)
SODIUM: 139 mmol/L (ref 135–145)
SODIUM: 138 mmol/L (ref 135–145)
Total Bilirubin: 0.7 mg/dL (ref 0.3–1.2)
Total Bilirubin: 0.8 mg/dL (ref 0.3–1.2)
Total Protein: 7 g/dL (ref 6.5–8.1)
Total Protein: 7 g/dL (ref 6.5–8.1)

## 2017-10-08 LAB — CBC WITH DIFFERENTIAL/PLATELET
Basophils Absolute: 0 10*3/uL (ref 0.0–0.1)
Basophils Relative: 0 %
EOS ABS: 0.1 10*3/uL (ref 0.0–0.7)
EOS PCT: 2 %
HCT: 39.1 % (ref 39.0–52.0)
HEMOGLOBIN: 12.7 g/dL — AB (ref 13.0–17.0)
LYMPHS PCT: 21 %
Lymphs Abs: 1.5 10*3/uL (ref 0.7–4.0)
MCH: 22.9 pg — AB (ref 26.0–34.0)
MCHC: 32.5 g/dL (ref 30.0–36.0)
MCV: 70.5 fL — AB (ref 78.0–100.0)
Monocytes Absolute: 0.4 10*3/uL (ref 0.1–1.0)
Monocytes Relative: 6 %
NEUTROS PCT: 71 %
Neutro Abs: 5.1 10*3/uL (ref 1.7–7.7)
Platelets: 247 10*3/uL (ref 150–400)
RBC: 5.55 MIL/uL (ref 4.22–5.81)
RDW: 14.5 % (ref 11.5–15.5)
WBC: 7.1 10*3/uL (ref 4.0–10.5)

## 2017-10-08 LAB — CK: CK TOTAL: 10023 U/L — AB (ref 49–397)

## 2017-10-08 LAB — MAGNESIUM: Magnesium: 2 mg/dL (ref 1.7–2.4)

## 2017-10-08 MED ORDER — GUAIFENESIN-DM 100-10 MG/5ML PO SYRP
5.0000 mL | ORAL_SOLUTION | ORAL | Status: DC | PRN
  Administered 2017-10-08 – 2017-10-09 (×2): 5 mL via ORAL
  Filled 2017-10-08 (×2): qty 10

## 2017-10-08 NOTE — Progress Notes (Signed)
Patient ID: Randy Hamilton, male   DOB: 12-16-1990, 27 y.o.   MRN: [id number]  PROGRESS NOTE    Randy Rightersaiah D Cianci  AOZ:[id number] DOB: 12-16-1990 DOA: 10/04/2017 PCP: Patient, No Pcp Per   Brief Narrative:  27 year old male with no past medical history presented with severe back pain and generalized body ache.  Patient reported to be lifting weights.  He was found to have severe elevation of his creatinine kinase and admitted for aggressive IV fluid administration.   Assessment & Plan:   Active Problems:   Non-traumatic rhabdomyolysis   Rhabdomyolysis with acute transaminitis: Triggered by weightlifting.  Presented with severe back pain, generalized body ache. Overall condition improving.  CK is trending down.  Repeat a.m. CK.  Elevated LFTs probably secondary to rhabdomyolysis.  LFTs improving.  Repeat a.m. LFTs Continue aggressive IV fluids. Kidney function stable at this time. Patient encouraged to ambulate.  Also encouraged for increasing  oral intake.  DVT prophylaxis: None.  Patient ambulating Code Status: Full Family Communication: Spoke to patient and girlfriend at bedside Disposition Plan: Home probably in a.m. if CK level keeps improving  Consultants: None  Procedures: None  Antimicrobials: None  Subjective: Patient seen and examined at bedside.  He denies any overnight fever, nausea or vomiting.  No muscle pains.  Objective: Vitals:   10/07/17 1312 10/07/17 2143 10/08/17 0457 10/08/17 0620  BP: 130/66 123/69 (!) 120/57   Pulse: 68 74 70   Resp:  20 18   Temp: 99.5 F (37.5 C) 99 F (37.2 C) 98.7 F (37.1 C)   TempSrc: Oral Oral Oral   SpO2: 100% 100% 99%   Weight:    85.7 kg (188 lb 15 oz)  Height:        Intake/Output Summary (Last 24 hours) at 10/08/2017 1301 Last data filed at 10/08/2017 0955 Gross per 24 hour  Intake 5006.67 ml  Output 3670 ml  Net 1336.67 ml   Filed Weights   10/06/17 0546 10/07/17 0608 10/08/17 0620  Weight: 86.4 kg (190 lb  7.6 oz) 86.9 kg (191 lb 9.6 oz) 85.7 kg (188 lb 15 oz)    Examination:  General exam: Appears calm and comfortable  Respiratory system: Bilateral decreased breath sound at bases Cardiovascular system: S1 & S2 heard, rate controlled  gastrointestinal system: Abdomen is nondistended, soft and nontender. Normal bowel sounds heard. Extremities: No cyanosis, clubbing, edema   Data Reviewed: I have personally reviewed following labs and imaging studies  CBC: Recent Labs  Lab 10/04/17 1234 10/08/17 0734  WBC 12.1* 7.1  NEUTROABS  --  5.1  HGB 15.2 12.7*  HCT 44.7 39.1  MCV 68.8* 70.5*  PLT 246 247   Basic Metabolic Panel: Recent Labs  Lab 10/04/17 1234 10/05/17 0421 10/06/17 0434 10/07/17 0421 10/08/17 0734  NA 136 136 137 140 139  138  K 4.1 4.0 3.5 3.8 4.1  4.0  CL 99* 104 106 108 103  103  CO2 23 23 23 23 24  24   GLUCOSE 116* 113* 100* 95 92  96  BUN 15 12 8 7 8  8   CREATININE 1.06 0.96 0.82 0.76 0.96  0.80  CALCIUM 9.3 8.3* 8.1* 8.3* 8.9  8.9  MG  --   --   --   --  2.0   GFR: Estimated Creatinine Clearance: 150.9 mL/min (by C-G formula based on SCr of 0.8 mg/dL). Liver Function Tests: Recent Labs  Lab 10/04/17 1234 10/05/17 0421 10/06/17 [id number] 10/07/17 0421 10/08/17 [id number]  AST 1,024* 713* 520* 395* 311*  310*  ALT 476* 392* 336* 300* 319*  322*  ALKPHOS 88 69 61 58 74  75  BILITOT 1.2 1.5* 1.2 1.1 0.7  0.8  PROT 7.9 6.6 6.0* 5.9* 7.0  7.0  ALBUMIN 4.3 3.3* 2.9* 2.8* 3.3*  3.2*   Recent Labs  Lab 10/04/17 1234  LIPASE 22   No results for input(s): AMMONIA in the last 168 hours. Coagulation Profile: No results for input(s): INR, PROTIME in the last 168 hours. Cardiac Enzymes: Recent Labs  Lab 10/04/17 1234 10/05/17 0421 10/06/17 0434 10/07/17 0421 10/08/17 0734  CKTOTAL >50,000* 49,558* 29,890* 18,474* 10,023*   BNP (last 3 results) No results for input(s): PROBNP in the last 8760 hours. HbA1C: No results for input(s): HGBA1C in  the last 72 hours. CBG: No results for input(s): GLUCAP in the last 168 hours. Lipid Profile: No results for input(s): CHOL, HDL, LDLCALC, TRIG, CHOLHDL, LDLDIRECT in the last 72 hours. Thyroid Function Tests: No results for input(s): TSH, T4TOTAL, FREET4, T3FREE, THYROIDAB in the last 72 hours. Anemia Panel: No results for input(s): VITAMINB12, FOLATE, FERRITIN, TIBC, IRON, RETICCTPCT in the last 72 hours. Sepsis Labs: No results for input(s): PROCALCITON, LATICACIDVEN in the last 168 hours.  No results found for this or any previous visit (from the past 240 hour(s)).       Radiology Studies: No results found.      Scheduled Meds: . bisacodyl  10 mg Rectal Once  . polyethylene glycol  17 g Oral Daily   Continuous Infusions: . sodium chloride 200 mL/hr at 10/08/17 0335     LOS: 4 days        Glade Lloyd, MD [facility] Pager [phone]  If 7PM-7AM, please contact night-coverage [email or web address] Password [email or web address] 10/08/2017, 1:01 PM

## 2017-10-08 NOTE — Plan of Care (Signed)
  Progressing Coping: Level of anxiety will decrease 10/08/2017 1341 - Progressing by Toussaint Golson, Alben SpittleMary E, RN Pain Managment: General experience of comfort will improve 10/08/2017 1341 - Progressing by Tegan Burnside, Alben SpittleMary E, RN

## 2017-10-09 DIAGNOSIS — M6282 Rhabdomyolysis: Principal | ICD-10-CM

## 2017-10-09 LAB — CK: Total CK: 6723 U/L — ABNORMAL HIGH (ref 49–397)

## 2017-10-09 LAB — COMPREHENSIVE METABOLIC PANEL
ALBUMIN: 2.9 g/dL — AB (ref 3.5–5.0)
ALK PHOS: 70 U/L (ref 38–126)
ALT: 258 U/L — ABNORMAL HIGH (ref 17–63)
AST: 190 U/L — AB (ref 15–41)
Anion gap: 8 (ref 5–15)
BILIRUBIN TOTAL: 0.6 mg/dL (ref 0.3–1.2)
BUN: 7 mg/dL (ref 6–20)
CO2: 24 mmol/L (ref 22–32)
Calcium: 8.6 mg/dL — ABNORMAL LOW (ref 8.9–10.3)
Chloride: 104 mmol/L (ref 101–111)
Creatinine, Ser: 0.89 mg/dL (ref 0.61–1.24)
GFR calc Af Amer: >60 mL/min (ref >60)
GFR calc non Af Amer: >60 mL/min (ref >60)
GLUCOSE: 101 mg/dL — AB (ref 65–99)
POTASSIUM: 3.8 mmol/L (ref 3.5–5.1)
Sodium: 136 mmol/L (ref 135–145)
TOTAL PROTEIN: 6.4 g/dL — AB (ref 6.5–8.1)

## 2017-10-09 NOTE — Discharge Summary (Signed)
Physician Discharge Summary  Suzzette Rightersaiah D Neubecker NWG:[id number] DOB: [birth year] DOA: 10/04/2017  PCP: Randy Hamilton, No Pcp Per  Admit date: 10/04/2017 Discharge date: 10/09/2017  Admitted From: Home Disposition:  Home  Discharge Condition: Stable CODE STATUS:Full Diet recommendation:  Regular  Brief/Interim Summary: Randy Hamilton is a 27 year old male  no significant past medical history who presented to the emergency department with complaint of severe back pain, generalized body ache.  Randy Hamilton reported to be lifting weights.  Randy Hamilton was found to have severe elevation of his creatinine kinase.  He was admitted for aggressive IV fluid administration. Randy Hamilton's CK level continued to trend down with IV fluids.  His renal function remained stable. He has been discharged today to home.  He has been advised to drink plenty of fluids and coming few days .[facility].  Problem list:  Rhabdomyolysis: Triggered by weightlifting.  Presented with severe back pain, generalized body ache. Overall condition improving.  CK is trending down. Treated with  aggressive IV fluids. Kidney function stable at this time   Discharge Diagnoses:  Active Problems:   Non-traumatic rhabdomyolysis    Discharge Instructions  Discharge Instructions    Diet - low sodium heart healthy   Complete by:  As directed    Discharge instructions   Complete by:  As directed    1) Please drink plenty of water. At least 4-5 Litres a day  a day for coming 2-3 days. 2) [facility].Appointment has been set up . Please call at the end of February to confirm the appointment. 3) Do BMP ,LFT and CK level testing [facility].   Increase activity slowly   Complete by:  As directed      Allergies as of 10/09/2017   No Known Allergies     Medication List    TAKE these medications   cyclobenzaprine 5 MG tablet Commonly known  as:  FLEXERIL Take 1 tablet (5 mg total) by mouth 2 (two) times daily as needed for muscle spasms.   ibuprofen 200 MG tablet Commonly known as:  ADVIL,MOTRIN Take 800 mg by mouth 2 (two) times daily as needed (BACK PAIN).   ibuprofen 800 MG tablet Commonly known as:  ADVIL,MOTRIN Take 1 tablet (800 mg total) by mouth 3 (three) times daily.   oxyCODONE-acetaminophen 5-325 MG tablet Commonly known as:  PERCOCET Take 1-2 tablets by mouth every 4 (four) hours as needed for severe pain.      Follow-up Information    [facility] Follow up on 10/15/2017.   Specialty:  Internal Medicine Why:  Appointment March 1,1019 at 1:00 PM. Please keep this appointment.  Contact information: [address] [place] [id number] [phone]         No Known Allergies  Consultations: None  Procedures/Studies: Ct Abdomen Pelvis W Contrast  Result Date: 10/04/2017 CLINICAL DATA:  Nausea, vomiting. EXAM: CT ABDOMEN AND PELVIS WITH CONTRAST TECHNIQUE: Multidetector CT imaging of the abdomen and pelvis was performed using the standard protocol following bolus administration of intravenous contrast. CONTRAST:  100mL ISOVUE-300 IOPAMIDOL (ISOVUE-300) INJECTION 61% COMPARISON:  None. FINDINGS: Lower chest: No acute abnormality. Hepatobiliary: No focal liver abnormality is seen. No gallstones, gallbladder wall thickening, or biliary dilatation. Pancreas: Unremarkable. No pancreatic ductal dilatation or surrounding inflammatory changes. Spleen: Normal in size without focal abnormality. Adrenals/Urinary Tract: Adrenal glands are unremarkable. Kidneys are normal, without renal  calculi, focal lesion, or hydronephrosis. Bladder is unremarkable. Stomach/Bowel: Stomach is within normal limits. Appendix appears normal. No evidence of bowel wall thickening, distention, or inflammatory changes. Vascular/Lymphatic: No significant vascular findings are present. No enlarged abdominal or  pelvic lymph nodes. Reproductive: Prostate is unremarkable. Other: No abdominal wall hernia or abnormality. No abdominopelvic ascites. Musculoskeletal: No acute or significant osseous findings. IMPRESSION: No definite abnormality seen in the abdomen or pelvis. Electronically Signed   By: Lupita Raider, M.D.   On: 10/04/2017 14:51   Ct L-spine No Charge  Result Date: 10/04/2017 CLINICAL DATA:  Low back pain and spasms today.  No known injury. EXAM: CT LUMBAR SPINE WITHOUT CONTRAST TECHNIQUE: Multidetector CT imaging of the lumbar spine was performed without intravenous contrast administration. Multiplanar CT image reconstructions were also generated. COMPARISON:  CT abdomen and pelvis today. Plain films lumbar spine 08/08/2017. FINDINGS: Segmentation: The Randy Hamilton has transitional lumbosacral anatomy. Numbering scheme on this examination is based on the lowest visualized pair of ribs which are hypoplastic. There is sacralization of the lowest lumbar segment on the left. Alignment: Normal. Vertebrae: No fracture or focal lesion.  No pars defect. Paraspinal and other soft tissues: Negative. Disc levels: Disc space height is maintained at all levels. The central canal and foramina appear open. IMPRESSION: Negative lumbar spine. No finding to explain the Randy Hamilton's symptoms. Transitional lumbosacral anatomy incidentally noted. Electronically Signed   By: Drusilla Kanner M.D.   On: 10/04/2017 15:21   (Echo, Carotid, EGD, Colonoscopy, ERCP)    Subjective: Randy Hamilton seen and examined the bedside this morning.  Remains comfortable.  His CK level has trended down to around 6000.  We will discharge him today.  I have instructed him to drink plenty of fluids in coming days.  Discharge Exam: Vitals:   10/08/17 2022 10/09/17 0535  BP: 140/61 136/62  Pulse: 67 66  Resp: 18 14  Temp: 99 F (37.2 C) 98.5 F (36.9 C)  SpO2: 96% 99%   Vitals:   10/08/17 0620 10/08/17 1341 10/08/17 2022 10/09/17 0535  BP:  (!)  115/54 140/61 136/62  Pulse:  68 67 66  Resp:   18 14  Temp:  99.4 F (37.4 C) 99 F (37.2 C) 98.5 F (36.9 C)  TempSrc:  Oral Oral Oral  SpO2:  98% 96% 99%  Weight: 85.7 kg (188 lb 15 oz)   83.8 kg (184 lb 12.8 oz)  Height:        General: Pt is alert, awake, not in acute distress Cardiovascular: RRR, S1/S2 +, no rubs, no gallops Respiratory: CTA bilaterally, no wheezing, no rhonchi Abdominal: Soft, NT, ND, bowel sounds + Extremities: no edema, no cyanosis    The results of significant diagnostics from this hospitalization (including imaging, microbiology, ancillary and laboratory) are listed below for reference.     Microbiology: No results found for this or any previous visit (from the past 240 hour(s)).   Labs: BNP (last 3 results) No results for input(s): BNP in the last 8760 hours. Basic Metabolic Panel: Recent Labs  Lab 10/05/17 0421 10/06/17 0434 10/07/17 0421 10/08/17 0734 10/09/17 0448  NA 136 137 140 139  138 136  K 4.0 3.5 3.8 4.1  4.0 3.8  CL 104 106 108 103  103 104  CO2 23 23 23 24  24 24   GLUCOSE 113* 100* 95 92  96 101*  BUN 12 8 7 8  8 7   CREATININE 0.96 0.82 0.76 0.96  0.80 0.89  CALCIUM 8.3*  8.1* 8.3* 8.9  8.9 8.6*  MG  --   --   --  2.0  --    Liver Function Tests: Recent Labs  Lab 10/05/17 0421 10/06/17 0434 10/07/17 0421 10/08/17 0734 10/09/17 0448  AST 713* 520* 395* 311*  310* 190*  ALT 392* 336* 300* 319*  322* 258*  ALKPHOS 69 61 58 74  75 70  BILITOT 1.5* 1.2 1.1 0.7  0.8 0.6  PROT 6.6 6.0* 5.9* 7.0  7.0 6.4*  ALBUMIN 3.3* 2.9* 2.8* 3.3*  3.2* 2.9*   Recent Labs  Lab 10/04/17 1234  LIPASE 22   No results for input(s): AMMONIA in the last 168 hours. CBC: Recent Labs  Lab 10/04/17 1234 10/08/17 0734  WBC 12.1* 7.1  NEUTROABS  --  5.1  HGB 15.2 12.7*  HCT 44.7 39.1  MCV 68.8* 70.5*  PLT 246 247   Cardiac Enzymes: Recent Labs  Lab 10/05/17 0421 10/06/17 0434 10/07/17 0421 10/08/17 0734  10/09/17 0448  CKTOTAL 49,558* 29,890* 18,474* 10,023* 6,723*   BNP: Invalid input(s): POCBNP CBG: No results for input(s): GLUCAP in the last 168 hours. D-Dimer No results for input(s): DDIMER in the last 72 hours. Hgb A1c No results for input(s): HGBA1C in the last 72 hours. Lipid Profile No results for input(s): CHOL, HDL, LDLCALC, TRIG, CHOLHDL, LDLDIRECT in the last 72 hours. Thyroid function studies No results for input(s): TSH, T4TOTAL, T3FREE, THYROIDAB in the last 72 hours.  Invalid input(s): FREET3 Anemia work up No results for input(s): VITAMINB12, FOLATE, FERRITIN, TIBC, IRON, RETICCTPCT in the last 72 hours. Urinalysis    Component Value Date/Time   COLORURINE YELLOW 10/04/2017 1405   APPEARANCEUR CLEAR 10/04/2017 1405   LABSPEC 1.026 10/04/2017 1405   PHURINE 6.0 10/04/2017 1405   GLUCOSEU NEGATIVE 10/04/2017 1405   HGBUR LARGE (A) 10/04/2017 1405   BILIRUBINUR NEGATIVE 10/04/2017 1405   KETONESUR 20 (A) 10/04/2017 1405   PROTEINUR 100 (A) 10/04/2017 1405   NITRITE NEGATIVE 10/04/2017 1405   LEUKOCYTESUR NEGATIVE 10/04/2017 1405   Sepsis Labs Invalid input(s): PROCALCITONIN,  WBC,  LACTICIDVEN Microbiology No results found for this or any previous visit (from the past 240 hour(s)).   Time coordinating discharge: Over 30 minutes  SIGNED:   Meredith Leeds, MD  [facility] 10/09/2017, 10:55 AM Pager [phone]  If 7PM-7AM, please contact night-coverage [email or web address] Password [email or web address]

## 2017-10-15 ENCOUNTER — Encounter: Payer: Self-pay | Admitting: Family Medicine

## 2017-10-15 ENCOUNTER — Other Ambulatory Visit: Payer: Self-pay

## 2017-10-15 ENCOUNTER — Ambulatory Visit (INDEPENDENT_AMBULATORY_CARE_PROVIDER_SITE_OTHER): Payer: Self-pay | Admitting: Family Medicine

## 2017-10-15 VITALS — BP 108/69 | HR 66 | Temp 98.0°F | Ht 72.0 in | Wt 180.0 lb

## 2017-10-15 DIAGNOSIS — Z202 Contact with and (suspected) exposure to infections with a predominantly sexual mode of transmission: Secondary | ICD-10-CM

## 2017-10-15 DIAGNOSIS — Z23 Encounter for immunization: Secondary | ICD-10-CM

## 2017-10-15 DIAGNOSIS — R7401 Elevation of levels of liver transaminase levels: Secondary | ICD-10-CM

## 2017-10-15 DIAGNOSIS — M6282 Rhabdomyolysis: Secondary | ICD-10-CM

## 2017-10-15 DIAGNOSIS — R740 Nonspecific elevation of levels of transaminase and lactic acid dehydrogenase [LDH]: Secondary | ICD-10-CM

## 2017-10-15 NOTE — Progress Notes (Signed)
Subjective:    Patient ID: Randy Hamilton, male    DOB: [birth year], 27 y.o.   MRN: [id number]  HPI 27 year old male presents to establish care.  Patient was in usual state of health until presenting to the emergency department with complaints of low back pain after doing heavy lifting and routine exercise.  Patient was admitted to inpatient services on 10/04/2017.  He was found to have pain with movement and increased tenderness to palpation around the paraspinal region.  He continues to have mild pain to lower back.  Upon lab testing, CK level was markedly elevated and liver enzymes were elevated indicating transaminitis.  Patient was treated with  IV fluids and discharged home in stable condition.  Past Medical History:  Diagnosis Date  . Heart murmur    as a child   There is no immunization history for the selected administration types on file for this patient. Social History   Socioeconomic History  . Marital status: Single    Spouse name: Not on file  . Number of children: Not on file  . Years of education: Not on file  . Highest education level: Not on file  Social Needs  . Financial resource strain: Not on file  . Food insecurity - worry: Not on file  . Food insecurity - inability: Not on file  . Transportation needs - medical: Not on file  . Transportation needs - non-medical: Not on file  Occupational History  . Not on file  Tobacco Use  . Smoking status: Never Smoker  . Smokeless tobacco: Never Used  Substance and Sexual Activity  . Alcohol use: Yes    Comment: occ  . Drug use: No  . Sexual activity: Not on file  Other Topics Concern  . Not on file  Social History Narrative  . Not on file   No Known Allergies   Review of Systems  Constitutional: Negative for fever and unexpected weight change.  Eyes: Negative for photophobia and visual disturbance.  Respiratory: Negative.   Cardiovascular: Negative.   Gastrointestinal: Negative.   Endocrine: Negative for  polydipsia, polyphagia and polyuria.  Genitourinary: Negative.   Musculoskeletal: Positive for myalgias.  Skin: Negative.   Neurological: Negative.   Hematological: Negative.   Psychiatric/Behavioral: Negative.        Objective:   Physical Exam BP 108/69 (BP Location: Right Arm, Patient Position: Sitting, Cuff Size: Normal)   Pulse 66   Temp 98 F (36.7 C) (Oral)   Ht 6' (1.829 m)   Wt 180 lb (81.6 kg)   SpO2 100%   BMI 24.41 kg/m   General Appearance:    Alert, cooperative, no distress, appears stated age  Head:    Normocephalic, without obvious abnormality, atraumatic  Eyes:    PERRL, conjunctiva/corneas clear, EOM's intact, fundi    benign, both eyes       Ears:    Normal TM's and external ear canals, both ears  Nose:   Nares normal, septum midline, mucosa normal, no drainage   or sinus tenderness  Throat:   Lips, mucosa, and tongue normal; teeth and gums normal  Neck:   Supple, symmetrical, trachea midline, no adenopathy;       thyroid:  No enlargement/tenderness/nodules; no carotid   bruit or JVD  Back:     Symmetric, no curvature, ROM normal, no CVA tenderness  Lungs:     Clear to auscultation bilaterally, respirations unlabored  Chest wall:    No tenderness or deformity  Heart:  Regular rate and rhythm, S1 and S2 normal, no murmur, rub   or gallop  Abdomen:     Soft, non-tender, bowel sounds active all four quadrants,    no masses, no organomegaly  Extremities:   Extremities normal, atraumatic, no cyanosis or edema  Pulses:   2+ and symmetric all extremities  Skin:   Skin color, texture, turgor normal, no rashes or lesions  Lymph nodes:   Cervical, supraclavicular, and axillary nodes normal  Neurologic:   CNII-XII intact. Normal strength, sensation and reflexes      throughout        BP 108/69 (BP Location: Right Arm, Patient Position: Sitting, Cuff Size: Normal)   Pulse 66   Temp 98 F (36.7 C) (Oral)   Ht 6' (1.829 m)   Wt 180 lb (81.6 kg)   SpO2  100%   BMI 24.41 kg/m  Assessment & Plan:  Transaminitis - CMP and Liver  Non-traumatic rhabdomyolysis Will repeat labs in 1 month - CK - CMP and Liver  Possible exposure to STD - RPR - GC/Chlamydia Probe Amp([facility]) - HSV(herpes simplex vrs) 1+2 ab-IgG  Need for Tdap vaccination - Tdap vaccine greater than or equal to 7yo IM   RTC: 6 months for CPE   Nolon NationsLachina Moore Frankie Scipio  MSN, FNP-C [facility] [address]  [place], [place] [id number] [phone]

## 2017-10-16 ENCOUNTER — Other Ambulatory Visit: Payer: Self-pay | Admitting: Family Medicine

## 2017-10-16 DIAGNOSIS — B009 Herpesviral infection, unspecified: Secondary | ICD-10-CM | POA: Insufficient documentation

## 2017-10-16 DIAGNOSIS — M6282 Rhabdomyolysis: Secondary | ICD-10-CM

## 2017-10-16 LAB — CMP AND LIVER
ALBUMIN: 4.2 g/dL (ref 3.5–5.5)
ALT: 88 IU/L — ABNORMAL HIGH (ref 0–44)
AST: 25 IU/L (ref 0–40)
Alkaline Phosphatase: 96 IU/L (ref 39–117)
BILIRUBIN TOTAL: 0.2 mg/dL (ref 0.0–1.2)
BILIRUBIN, DIRECT: 0.11 mg/dL (ref 0.00–0.40)
BUN: 15 mg/dL (ref 6–20)
CALCIUM: 9.3 mg/dL (ref 8.7–10.2)
CO2: 22 mmol/L (ref 20–29)
Chloride: 101 mmol/L (ref 96–106)
Creatinine, Ser: 0.94 mg/dL (ref 0.76–1.27)
GFR calc non Af Amer: 110 mL/min/{1.73_m2} (ref >59)
GFR, EST AFRICAN AMERICAN: 127 mL/min/{1.73_m2} (ref >59)
Glucose: 93 mg/dL (ref 65–99)
POTASSIUM: 4.2 mmol/L (ref 3.5–5.2)
SODIUM: 138 mmol/L (ref 134–144)
TOTAL PROTEIN: 7.4 g/dL (ref 6.0–8.5)

## 2017-10-16 LAB — CK: Total CK: 968 U/L (ref 24–204)

## 2017-10-16 LAB — HSV(HERPES SIMPLEX VRS) I + II AB-IGG: HSV 1 Glycoprotein G Ab, IgG: 36.1 index — ABNORMAL HIGH (ref 0.00–0.90)

## 2017-10-16 LAB — RPR: RPR Ser Ql: NONREACTIVE

## 2017-10-19 ENCOUNTER — Telehealth: Payer: Self-pay

## 2017-10-19 LAB — GC/CHLAMYDIA PROBE AMP
Chlamydia trachomatis, NAA: NEGATIVE
NEISSERIA GONORRHOEAE BY PCR: NEGATIVE

## 2017-10-19 NOTE — Telephone Encounter (Signed)
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  Recommend over-the-counter lysine 1000 mg daily to prevent type I outbreaks.  During periods of outbreak, he can schedule an appointment and we will  temporarily start antiviral medication to shorten course.  Also follow diet low in arginine as discussed during appointment on yesterday. Patient has a history of rhabdomyolysis, his CK levels are trending down.  He may continue to experience muscle aches periodically.  Recommend that he continue to increase water intake to greater than 64 ounces per day. Schedule patient to follow-up for labs in 1 month to ensure that creatinine kinase continues to trend down.  Thanks

## 2017-10-19 NOTE — Telephone Encounter (Signed)
Patient called and I advised that he does have herpes simplex type 1 that is consistent with cold sores. Recommended that he take otc lysine 1000mg  daily to prevent outbreaks. Advised that during outbreaks he can schedule an appointment and we will temporarily start an antiviral medication to shorten course. Asked that he follow a  Low arginine diet as discussed during appointment. Advised that CK levels are trending down and we need to recheck this in 1 month. Appointment for lab draw has been made. Advised that he may continue to experience muscle aches periodically and he should continue to increase water intake to greater than 64 ounces per day. Thanks!

## 2017-10-20 ENCOUNTER — Telehealth: Payer: Self-pay

## 2017-10-20 NOTE — Telephone Encounter (Signed)
Patient called and asked for test results for GC/Chlamydia. I advised that both test were negative. Thanks!

## 2017-10-21 NOTE — Patient Instructions (Signed)
Rhabdomyolysis Rhabdomyolysis is a condition that happens when muscle cells break down and release substances into the blood that can damage the kidneys. This condition happens because of damage to the muscles that move bones (skeletal muscle). When the skeletal muscles are damaged, substances inside the muscle cells go into the blood. One of these substances is a protein called myoglobin. Large amounts of myoglobin can cause kidney damage or kidney failure. Other substances that are released by muscle cells may upset the balance of the minerals (electrolytes) in your blood. This imbalance causes your blood to have too much acid (acidosis). What are the causes? This condition is caused by muscle damage. Muscle damage often happens because of:  Using your muscles too much.  An injury that crushes or squeezes a muscle too tightly.  Using illegal drugs, mainly cocaine.  Alcohol abuse.  Other possible causes include:  Prescription medicines, such as those that: ? Lower cholesterol (statins). ? Treat ADHD (attention deficit hyperactivity disorder) or help with weight loss (amphetamines). ? Treat pain (opiates).  Infections.  Muscle diseases that are passed down from parent to child (inherited).  High fever.  Heatstroke.  Not having enough fluids in your body (dehydration).  Seizures.  Surgery.  What increases the risk? This condition is more likely to develop in people who:  Have a family history of muscle disease.  Take part in extreme sports, such as running in marathons.  Have diabetes.  Are older.  Abuse drugs or alcohol.  What are the signs or symptoms? Symptoms of this condition vary. Some people have very few symptoms, and other people have many symptoms. The most common symptoms include:  Muscle pain and swelling.  Weak muscles.  Dark urine.  Feeling weak and tired.  Other symptoms include:  Nausea and vomiting.  Fever.  Pain in the abdomen.  Pain  in the joints.  Symptoms of complications from this condition include:  Heart rhythm that is not normal (arrhythmia).  Seizures.  Not urinating enough because of kidney failure.  Very low blood pressure (shock). Signs of shock include dizziness, blurry vision, and clammy skin.  Bleeding that is hard to stop or control.  How is this diagnosed? This condition is diagnosed based on your medical history, your symptoms, and a physical exam. Tests may also be done, including:  Blood tests.  Urine tests to check for myoglobin.  You may also have other tests to check for causes of muscle damage and to check for complications. How is this treated? Treatment for this condition helps to:  Make sure you have enough fluids in your body.  Lower the acid levels in your blood to reverse acidosis.  Protect your kidneys.  Treatment may include:  Fluids and medicines given through an IV tube that is inserted into one of your veins.  Medicines to lower acidosis or to bring back the balance of the minerals in your body.  Hemodialysis. This treatment uses an artificial kidney machine to filter your blood while you recover. You may have this if other treatments are not helping.  Follow these instructions at home:  Take over-the-counter and prescription medicines only as told by your health care provider.  Rest at home until your health care provider says that you can return to your normal activities.  Drink enough fluid to keep your urine clear or pale yellow.  Do not do activities that take a lot of effort (are strenuous). Ask your health care provider what level of exercise is safe   for you.  Do not abuse drugs or alcohol. If you are having problems with drug or alcohol use, ask your health care provider for help.  Keep all follow-up visits as told by your health care provider. This is important. Contact a health care provider if:  You start having symptoms of this condition after  treatment. Get help right away if:  You have a seizure.  You bleed easily or cannot control bleeding.  You cannot urinate.  You have chest pain.  You have trouble breathing. This information is not intended to replace advice given to you by your health care provider. Make sure you discuss any questions you have with your health care provider. Document Released: 07/16/2004 Document Revised: 05/15/2016 Document Reviewed: 05/15/2016 Elsevier Interactive Patient Education  2018 Elsevier Inc.  

## 2017-11-19 ENCOUNTER — Other Ambulatory Visit: Payer: Self-pay

## 2017-11-19 DIAGNOSIS — M6282 Rhabdomyolysis: Secondary | ICD-10-CM

## 2017-11-20 LAB — CK: Total CK: 950 U/L (ref 24–204)

## 2017-11-21 ENCOUNTER — Other Ambulatory Visit: Payer: Self-pay | Admitting: Family Medicine

## 2017-11-21 DIAGNOSIS — M6282 Rhabdomyolysis: Secondary | ICD-10-CM

## 2017-11-21 NOTE — Progress Notes (Signed)
Orders Placed This Encounter  Procedures  . CK (Creatine Kinase)    Standing Status:   Future    Standing Expiration Date:   11/22/2018     Nolon NationsLachina Moore Divinity Kyler  MSN, FNP-C Patient Care [facility] [address]  [place], [place] [address] [phone]

## 2017-11-22 ENCOUNTER — Telehealth: Payer: Self-pay

## 2017-11-22 NOTE — Telephone Encounter (Signed)
Called and spoke with patient, advised that CK level is still elevated and that he should increase fluid intake. Patient was transferred to scheduler to make 2 week follow up lab draw. Thanks!

## 2017-11-22 NOTE — Telephone Encounter (Signed)
-----   Message from [name], [place] sent at 11/21/2017  5:42 PM EDT ----- Regarding: lab results Please inform patient that CK continues to be elevated, which is consistent with rhabdomyolysis. Continue to increase fluid intake, schedule lab appointment in 2 weeks.   Nolon NationsLachina Moore Hollis  MSN, FNP-C Patient Care [facility] [address]  [place], [place] [address] [phone]

## 2017-12-06 ENCOUNTER — Other Ambulatory Visit: Payer: PRIVATE HEALTH INSURANCE

## 2017-12-20 ENCOUNTER — Emergency Department (HOSPITAL_COMMUNITY)
Admission: EM | Admit: 2017-12-20 | Discharge: 2017-12-20 | Disposition: A | Discharge status: Home / Self Care | Payer: 59 | Attending: Emergency Medicine | Admitting: Emergency Medicine

## 2017-12-20 ENCOUNTER — Emergency Department (HOSPITAL_COMMUNITY): Payer: 59

## 2017-12-20 ENCOUNTER — Encounter: Payer: Self-pay | Admitting: Family Medicine

## 2017-12-20 ENCOUNTER — Encounter (HOSPITAL_COMMUNITY): Payer: Self-pay | Admitting: *Deleted

## 2017-12-20 ENCOUNTER — Other Ambulatory Visit: Payer: Self-pay

## 2017-12-20 DIAGNOSIS — R1011 Right upper quadrant pain: Secondary | ICD-10-CM | POA: Diagnosis present

## 2017-12-20 DIAGNOSIS — R748 Abnormal levels of other serum enzymes: Secondary | ICD-10-CM | POA: Insufficient documentation

## 2017-12-20 DIAGNOSIS — R109 Unspecified abdominal pain: Secondary | ICD-10-CM

## 2017-12-20 LAB — COMPREHENSIVE METABOLIC PANEL
ALBUMIN: 3.9 g/dL (ref 3.5–5.0)
ALK PHOS: 91 U/L (ref 38–126)
ALT: 19 U/L (ref 17–63)
ANION GAP: 10 (ref 5–15)
AST: 23 U/L (ref 15–41)
BUN: 12 mg/dL (ref 6–20)
CALCIUM: 9 mg/dL (ref 8.9–10.3)
CHLORIDE: 106 mmol/L (ref 101–111)
CO2: 23 mmol/L (ref 22–32)
Creatinine, Ser: 1.1 mg/dL (ref 0.61–1.24)
GFR calc Af Amer: >60 mL/min (ref >60)
GFR calc non Af Amer: >60 mL/min (ref >60)
GLUCOSE: 131 mg/dL — AB (ref 65–99)
Potassium: 3.7 mmol/L (ref 3.5–5.1)
SODIUM: 139 mmol/L (ref 135–145)
Total Bilirubin: 0.7 mg/dL (ref 0.3–1.2)
Total Protein: 6.8 g/dL (ref 6.5–8.1)

## 2017-12-20 LAB — CBC
HEMATOCRIT: 40 % (ref 39.0–52.0)
HEMOGLOBIN: 12.9 g/dL — AB (ref 13.0–17.0)
MCH: 22.6 pg — AB (ref 26.0–34.0)
MCHC: 32.3 g/dL (ref 30.0–36.0)
MCV: 69.9 fL — AB (ref 78.0–100.0)
Platelets: 218 10*3/uL (ref 150–400)
RBC: 5.72 MIL/uL (ref 4.22–5.81)
RDW: 15.2 % (ref 11.5–15.5)
WBC: 4.8 10*3/uL (ref 4.0–10.5)

## 2017-12-20 LAB — URINALYSIS, ROUTINE W REFLEX MICROSCOPIC
BACTERIA UA: NONE SEEN
Bilirubin Urine: NEGATIVE
Glucose, UA: NEGATIVE mg/dL
Ketones, ur: NEGATIVE mg/dL
Leukocytes, UA: NEGATIVE
Nitrite: NEGATIVE
Protein, ur: NEGATIVE mg/dL
SPECIFIC GRAVITY, URINE: 1.018 (ref 1.005–1.030)
pH: 5 (ref 5.0–8.0)

## 2017-12-20 LAB — LIPASE, BLOOD: Lipase: 25 U/L (ref 11–51)

## 2017-12-20 LAB — CK: CK TOTAL: 403 U/L — AB (ref 49–397)

## 2017-12-20 MED ORDER — METHOCARBAMOL 500 MG PO TABS: 500.0000 mg | ORAL_TABLET | Freq: Two times a day (BID) | ORAL | 0 refills | Status: AC

## 2017-12-20 NOTE — ED Notes (Signed)
Patient Alert and oriented to baseline. Stable and ambulatory to baseline. Patient verbalized understanding of the discharge instructions.  Patient belongings were taken by the patient.   

## 2017-12-20 NOTE — ED Triage Notes (Signed)
Pt here c/o abd pain, pt c/o RUQ pain onset Friday worsening today, pt denies n/v/d, pt reports elevated CK in April 2019 since gym injury, pt c/o R lowert back pain, A&O x4

## 2017-12-20 NOTE — ED Provider Notes (Signed)
Patient placed in Quick Look pathway, seen and evaluated   Chief Complaint: RUQ pain  HPI:   Pt with ruq pain, started 4 days ago. No n/v/d. No problems with eating. Reports associated right flank pain, but states could be old where he was in the hospital for rhabdomyolysis, 2 months ago. No fever or chills. No urinary symptoms. No diarrhea. Did not take anything prior to coming.   ROS: positive for abd pain. Negative for n/v/d, no urinary symptoms. Hx of rhabdo  Physical Exam:   Gen: No distress  Neuro: Awake and Alert  Skin: Warm    Focused Exam: nad, TTP in right upper quadrant   Initiation of care has begun. The patient has been counseled on the process, plan, and necessity for staying for the completion/evaluation, and the remainder of the medical screening examination  Patient with right upper quadrant pain.  History of rhabdomyolysis.  Will get basic abdominal labs as well as CK levels.  Will also get right upper quadrant ultrasound.  No acute distress otherwise, normal vital signs.    Jaynie Crumble, PA-C 12/20/17 1336    Mancel Bale, MD 12/21/17 [phone]

## 2017-12-20 NOTE — Discharge Instructions (Addendum)
Please read attached information. If you experience any new or worsening signs or symptoms please return to the emergency room for evaluation. Please follow-up with your primary care provider or specialist as discussed. Please use medication prescribed only as directed and discontinue taking if you have any concerning signs or symptoms.   °

## 2017-12-20 NOTE — ED Provider Notes (Signed)
[facility] EMERGENCY DEPARTMENT Provider Note   CSN: [id number] Arrival date & time: 12/20/17  1252     History   Chief Complaint Chief Complaint  Patient presents with  . Abdominal Pain    HPI Randy Hamilton is a 27 y.o. male.  HPI   27 year old male presents today with right upper quadrant abdominal pain.  Patient notes that 3 days ago he was laughing and felt a sharp pain in his right upper quadrant.  He notes that whenever he laughs the symptoms return.  He denies any shortness of breath, denies any pain with inspiration, denies any pain with palpation.  Not worse with eating or drinking.  Patient denies any fever chills nausea or vomiting.  He denies any lower abdominal pain dysuria, nor any urinary complaints.  No history of the same.  No history of significant risk factors for DVT or PE.  Rhabdomyolysis in February 2019.  He notes his primary care has been trending his CK which has slowly been going down.  Past Medical History:  Diagnosis Date  . Heart murmur    as a child     Patient Active Problem List   Diagnosis Date Noted  . Herpes simplex type 1 infection 10/16/2017  . Non-traumatic rhabdomyolysis 10/04/2017  . Rupture of left patellar tendon 07/12/2015  . Patellar tendon rupture 07/12/2015    Past Surgical History:  Procedure Laterality Date  . epidermal cyst removed from testicle     . PATELLAR TENDON REPAIR Left 07/12/2015   Procedure: DIRECT PRIMARY REPAIR LEFT PATELLA TENDON;  Surgeon: Kathryne Hitch, MD;  Location: [facility];  Service: Orthopedics;  Laterality: Left;        Home Medications    Prior to Admission medications   Medication Sig Start Date End Date Taking? Authorizing Provider  methocarbamol (ROBAXIN) 500 MG tablet Take 1 tablet (500 mg total) by mouth 2 (two) times daily. 12/20/17   Preslea Rhodus, Tinnie Gens, PA-C  oxyCODONE-acetaminophen (PERCOCET) 5-325 MG tablet Take 1-2 tablets by mouth every 4 (four) hours as needed  for severe pain. Patient not taking: Reported on 10/04/2017 07/12/15   Kathryne Hitch, MD    Family History Family History  Problem Relation Age of Onset  . Hypertension Mother   . Cancer Mother   . Cancer Brother   . Cancer Other     Social History Social History   Tobacco Use  . Smoking status: Never Smoker  . Smokeless tobacco: Never Used  Substance Use Topics  . Alcohol use: Yes    Comment: occ  . Drug use: No     Allergies   Patient has no known allergies.   Review of Systems Review of Systems  All other systems reviewed and are negative.    Physical Exam Updated Vital Signs BP 128/71 (BP Location: Right Arm)   Pulse 61   Temp 99.2 F (37.3 C) (Oral)   Resp 12   Ht 6' (1.829 m)   Wt 84.8 kg (187 lb)   SpO2 99%   BMI 25.36 kg/m   Physical Exam  Constitutional: He is oriented to person, place, and time. He appears well-developed and well-nourished.  HENT:  Head: Normocephalic and atraumatic.  Eyes: Pupils are equal, round, and reactive to light. Conjunctivae are normal. Right eye exhibits no discharge. Left eye exhibits no discharge. No scleral icterus.  Neck: Normal range of motion. No JVD present. No tracheal deviation present.  Pulmonary/Chest: Effort normal. No stridor.  Abdominal:  Abdomen soft nontender with no masses, bowel sounds normal  Neurological: He is alert and oriented to person, place, and time. Coordination normal.  Psychiatric: He has a normal mood and affect. His behavior is normal. Judgment and thought content normal.  Nursing note and vitals reviewed.    ED Treatments / Results  Labs (all labs ordered are listed, but only abnormal results are displayed) Labs Reviewed  COMPREHENSIVE METABOLIC PANEL - Abnormal; Notable for the following components:      Result Value   Glucose, Bld 131 (*)    All other components within normal limits  CBC - Abnormal; Notable for the following components:   Hemoglobin 12.9 (*)     MCV 69.9 (*)    MCH 22.6 (*)    All other components within normal limits  URINALYSIS, ROUTINE W REFLEX MICROSCOPIC - Abnormal; Notable for the following components:   Hgb urine dipstick MODERATE (*)    All other components within normal limits  CK - Abnormal; Notable for the following components:   Total CK 403 (*)    All other components within normal limits  LIPASE, BLOOD    EKG None  Radiology US Abdomen Limited Ruq  Result Date: 12/20/2017 CLINICAL DATA:  Abdominal pain for 4 days. EXAM: ULTRASOUND ABDOMEN LIMITED RIGHT UPPER QUADRANT COMPARISON:  None. FINDINGS: Gallbladder: No gallstones or wall thickening visualized. No sonographic Murphy sign noted by sonographer. Common bile duct: Diameter: 2.4 mm Liver: No focal lesion identified. Within normal limits in parenchymal echogenicity. Portal vein is patent on color Doppler imaging with normal direction of blood flow towards the liver. IMPRESSION: 1. Small right pleural effusion. 2. No other abnormalities. Electronically Signed   By: Gerome Sam III M.D   On: 12/20/2017 14:10    Procedures Procedures (including critical care time)  Medications Ordered in ED Medications - No data to display   Initial Impression / Assessment and Plan / ED Course  I have reviewed the triage vital signs and the nursing notes.  Pertinent labs & imaging results that were available during my care of the patient were reviewed by me and considered in my medical decision making (see chart for details).    Labs: Urinalysis, lipase, CMP, CBC, CK  Imaging: Ultrasound abdomen limited right upper quadrant  Consults:  Therapeutics:  Discharge Meds:   Assessment/Plan:   27 year old male presents today with likely muscular pain.  Question diaphragmatic versus abdominal wall.  He has a soft nontender abdomen.  He has reassuring laboratory analysis low suspicion for liver or gallbladder pathology, no signs of infectious etiology.  Small right sided  pleural effusion noted on ultrasound, lung sound clear, no SOB. .  Patient CK slightly elevated here at 403 which is trending down from his last visit at 950.  Patient will continues outpatient follow-up with primary care, he is given strict return precautions, he verbalized understanding and agreement to today's plan had no further questions or concerns at the time of discharge.  Final Clinical Impressions(s) / ED Diagnoses   Final diagnoses:  Abdominal pain  Right upper quadrant abdominal pain  Elevated CK    ED Discharge Orders        Ordered    methocarbamol (ROBAXIN) 500 MG tablet  2 times daily     12/20/17 2045       Eyvonne Mechanic, PA-C 12/20/17 2138    Derwood Kaplan, MD 12/22/17 1745

## 2017-12-20 NOTE — ED Notes (Signed)
Patient ambulatory to the room, no signs of distress at this time.

## 2018-01-17 ENCOUNTER — Ambulatory Visit: Payer: PRIVATE HEALTH INSURANCE | Admitting: Family Medicine

## 2021-06-30 ENCOUNTER — Emergency Department (HOSPITAL_BASED_OUTPATIENT_CLINIC_OR_DEPARTMENT_OTHER)
Admission: EM | Admit: 2021-06-30 | Discharge: 2021-06-30 | Disposition: A | Discharge status: Home / Self Care | Payer: 59 | Attending: Emergency Medicine | Admitting: Emergency Medicine

## 2021-06-30 ENCOUNTER — Other Ambulatory Visit: Payer: Self-pay

## 2021-06-30 ENCOUNTER — Encounter (HOSPITAL_BASED_OUTPATIENT_CLINIC_OR_DEPARTMENT_OTHER): Payer: Self-pay | Admitting: Emergency Medicine

## 2021-06-30 DIAGNOSIS — R1084 Generalized abdominal pain: Secondary | ICD-10-CM | POA: Insufficient documentation

## 2021-06-30 DIAGNOSIS — R197 Diarrhea, unspecified: Secondary | ICD-10-CM | POA: Insufficient documentation

## 2021-06-30 DIAGNOSIS — R1012 Left upper quadrant pain: Secondary | ICD-10-CM | POA: Diagnosis present

## 2021-06-30 LAB — URINALYSIS, ROUTINE W REFLEX MICROSCOPIC
Bilirubin Urine: NEGATIVE
Glucose, UA: NEGATIVE mg/dL
Ketones, ur: NEGATIVE mg/dL
Leukocytes,Ua: NEGATIVE
Nitrite: NEGATIVE
Protein, ur: NEGATIVE mg/dL
Specific Gravity, Urine: 1.026 (ref 1.005–1.030)
pH: 6.5 (ref 5.0–8.0)

## 2021-06-30 LAB — CBC
HCT: 43.5 % (ref 39.0–52.0)
Hemoglobin: 13.4 g/dL (ref 13.0–17.0)
MCH: 21.7 pg — ABNORMAL LOW (ref 26.0–34.0)
MCHC: 30.8 g/dL (ref 30.0–36.0)
MCV: 70.4 fL — ABNORMAL LOW (ref 80.0–100.0)
Platelets: 239 10*3/uL (ref 150–400)
RBC: 6.18 MIL/uL — ABNORMAL HIGH (ref 4.22–5.81)
RDW: 15.4 % (ref 11.5–15.5)
WBC: 6.9 10*3/uL (ref 4.0–10.5)
nRBC: 0 % (ref 0.0–0.2)

## 2021-06-30 LAB — COMPREHENSIVE METABOLIC PANEL
ALT: 17 U/L (ref 0–44)
AST: 16 U/L (ref 15–41)
Albumin: 4.5 g/dL (ref 3.5–5.0)
Alkaline Phosphatase: 87 U/L (ref 38–126)
Anion gap: 9 (ref 5–15)
BUN: 15 mg/dL (ref 6–20)
CO2: 26 mmol/L (ref 22–32)
Calcium: 9.5 mg/dL (ref 8.9–10.3)
Chloride: 104 mmol/L (ref 98–111)
Creatinine, Ser: 1.08 mg/dL (ref 0.61–1.24)
GFR, Estimated: >60 mL/min (ref >60)
Glucose, Bld: 100 mg/dL — ABNORMAL HIGH (ref 70–99)
Potassium: 3.9 mmol/L (ref 3.5–5.1)
Sodium: 139 mmol/L (ref 135–145)
Total Bilirubin: 0.8 mg/dL (ref 0.3–1.2)
Total Protein: 7.5 g/dL (ref 6.5–8.1)

## 2021-06-30 LAB — LIPASE, BLOOD: Lipase: 16 U/L (ref 11–51)

## 2021-06-30 NOTE — Discharge Instructions (Signed)
Your blood work showed no issue with the level of blood in your body or signs of hepatitis or pancreatitis.  Try pepcid or tagamet up to twice a day.  Try to avoid things that may make this worse, most commonly these are spicy foods tomato based products fatty foods chocolate and peppermint.  Alcohol and tobacco can also make this worse.  Return to the emergency department for sudden worsening pain fever or inability to eat or drink.

## 2021-06-30 NOTE — ED Triage Notes (Signed)
Pt arrives to ED with abdominal pain for months. He reports most of his pain is LLQ. He states that last night his pain worsened and is sharp now compared to dull/achy. He rep[orts black tarry stools but has bene taking Pepto Bismol for his pain. He states he usually has abdominal pain 4 out of seven days a week. He has diarrhea. This pain occurs more often when he eats spicy foods.

## 2021-06-30 NOTE — ED Provider Notes (Signed)
[facility] EMERGENCY DEPT Provider Note   CSN: [id number] Arrival date & time: 06/30/21  0920     History Chief Complaint  Patient presents with   Abdominal Pain    Randy Hamilton is a 30 y.o. male.  30 yo M with a chief complaints of abdominal pain.  This seems to be worse after eating spicy things.  Is been off and on for many months.  He feels like he has pain about 4 days out of the week.  He has diarrhea fairly frequently.  He felt like his stool yesterday was somewhat dark and so his wife convinced him to come to the ED today.  He denies nausea or vomiting denies fevers.  Has no primary care doctor is never seen GI in the office.  The history is provided by the patient.  Abdominal Pain Pain location:  LUQ and LLQ Pain quality: aching and burning   Pain radiates to:  Does not radiate Pain severity:  Moderate Onset quality:  Gradual Duration:  5 weeks Timing:  Intermittent Progression:  Waxing and waning Chronicity:  New Relieved by:  Nothing Worsened by:  Eating (eating spicy foods) Associated symptoms: diarrhea   Associated symptoms: no chest pain, no chills, no fever, no shortness of breath and no vomiting       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   Non-traumatic rhabdomyolysis 10/04/2017   Rupture of left patellar tendon 07/12/2015   Patellar tendon rupture 07/12/2015    Past Surgical History:  Procedure Laterality Date   epidermal cyst removed from testicle      PATELLAR TENDON REPAIR Left 07/12/2015   Procedure: DIRECT PRIMARY REPAIR LEFT PATELLA TENDON;  Surgeon: Mcarthur Rossetti, MD;  Location: [facility];  Service: Orthopedics;  Laterality: Left;       Family History  Problem Relation Age of Onset   Hypertension Mother    Cancer Mother    Cancer Brother    Cancer Other     Social History    Tobacco Use   Smoking status: Never   Smokeless tobacco: Never  Vaping Use   Vaping Use: Never used  Substance Use Topics   Alcohol use: Yes    Comment: occ   Drug use: No    Home Medications Prior to Admission medications   Medication Sig Start Date End Date Taking? Authorizing Provider  methocarbamol (ROBAXIN) 500 MG tablet Take 1 tablet (500 mg total) by mouth 2 (two) times daily. 12/20/17   Hedges, Dellis Filbert, PA-C  oxyCODONE-acetaminophen (PERCOCET) 5-325 MG tablet Take 1-2 tablets by mouth every 4 (four) hours as needed for severe pain. Patient not taking: Reported on 10/04/2017 07/12/15   Mcarthur Rossetti, MD    Allergies    Patient has no known allergies.  Review of Systems   Review of Systems  Constitutional:  Negative for chills and fever.  HENT:  Negative for congestion and facial swelling.   Eyes:  Negative for discharge and visual disturbance.  Respiratory:  Negative for shortness of breath.   Cardiovascular:  Negative for chest pain and palpitations.  Gastrointestinal:  Positive for abdominal pain and diarrhea. Negative for vomiting.  Musculoskeletal:  Negative for arthralgias and myalgias.  Skin:  Negative for color change and rash.  Neurological:  Negative for tremors, syncope and headaches.  Psychiatric/Behavioral:  Negative for confusion and dysphoric mood.    Physical Exam Updated Vital Signs BP 130/82 (BP Location: Right Arm)   Pulse 80   Temp 98.4 F (36.9 C) (Oral)   Resp 18   Ht 6\' 1"  (1.854 m)   Wt 93.9 kg   SpO2 100%   BMI 27.31 kg/m   Physical Exam Vitals and nursing note reviewed.  Constitutional:      Appearance: He is well-developed.  HENT:     Head: Normocephalic and atraumatic.  Eyes:     Pupils: Pupils are equal, round, and reactive to light.  Neck:     Vascular: No JVD.  Cardiovascular:     Rate and Rhythm: Normal rate and regular rhythm.     Heart sounds: No murmur heard.   No friction rub. No gallop.  Pulmonary:      Effort: No respiratory distress.     Breath sounds: No wheezing.  Abdominal:     General: There is no distension.     Tenderness: There is no abdominal tenderness. There is no guarding or rebound.     Comments: Benign abdominal exam  Musculoskeletal:        General: Normal range of motion.     Cervical back: Normal range of motion and neck supple.  Skin:    Coloration: Skin is not pale.     Findings: No rash.  Neurological:     Mental Status: He is alert and oriented to person, place, and time.  Psychiatric:        Behavior: Behavior normal.    ED Results / Procedures / Treatments   Labs (all labs ordered are listed, but only abnormal results are displayed) Labs Reviewed  COMPREHENSIVE METABOLIC PANEL - Abnormal; Notable for the following components:      Result Value   Glucose, Bld 100 (*)    All other components within normal limits  CBC - Abnormal; Notable for the following components:   RBC 6.18 (*)    MCV 70.4 (*)    MCH 21.7 (*)    All other components within normal limits  URINALYSIS, ROUTINE W REFLEX MICROSCOPIC - Abnormal; Notable for the following components:   Hgb urine dipstick TRACE (*)    All other components within normal limits  LIPASE, BLOOD    EKG None  Radiology No results found.  Procedures Procedures   Medications Ordered in ED Medications - No data to display  ED Course  I have reviewed the triage vital signs and the nursing notes.  Pertinent labs & imaging results that were available during my care of the patient were reviewed by me and considered in my medical decision making (see chart for details).    MDM Rules/Calculators/A&P                           30 yo M with a chief complaints of abdominal pain.  This been off and on for some time.  Seems to be worse with spicy foods.  Most likely the patient has reflux.  He is laboratory test done today that are unremarkable.  LFTs and lipase are negative.  We will have him treat with H2  blockers.  With prolonged course of symptoms we will have him follow-up with GI.  11:03 AM:  I have discussed the diagnosis/risks/treatment options with the patient and family and believe the pt to be eligible for discharge home to follow-up with PCP. We  also discussed returning to the ED immediately if new or worsening sx occur. We discussed the sx which are most concerning (e.g., sudden worsening pain, fever, inability to tolerate by mouth) that necessitate immediate return. Medications administered to the patient during their visit and any new prescriptions provided to the patient are listed below.  Medications given during this visit Medications - No data to display   The patient appears reasonably screen and/or stabilized for discharge and I doubt any other medical condition or other [facility] requiring further screening, evaluation, or treatment in the ED at this time prior to discharge.   Final Clinical Impression(s) / ED Diagnoses Final diagnoses:  Generalized abdominal pain    Rx / DC Orders ED Discharge Orders     None        Deno Etienne, DO 06/30/21 1103

## 2021-07-01 ENCOUNTER — Emergency Department (HOSPITAL_COMMUNITY)
Admission: EM | Admit: 2021-07-01 | Discharge: 2021-07-01 | Disposition: A | Discharge status: Home / Self Care | Payer: 59 | Attending: Emergency Medicine | Admitting: Emergency Medicine

## 2021-07-01 ENCOUNTER — Encounter (HOSPITAL_COMMUNITY): Payer: Self-pay

## 2021-07-01 DIAGNOSIS — K219 Gastro-esophageal reflux disease without esophagitis: Secondary | ICD-10-CM | POA: Insufficient documentation

## 2021-07-01 DIAGNOSIS — R109 Unspecified abdominal pain: Secondary | ICD-10-CM | POA: Diagnosis present

## 2021-07-01 MED ORDER — ALUM & MAG HYDROXIDE-SIMETH 200-200-20 MG/5ML PO SUSP
30.0000 mL | Freq: Once | ORAL | Status: AC
  Administered 2021-07-01: 30 mL via ORAL
  Filled 2021-07-01: qty 30

## 2021-07-01 MED ORDER — LIDOCAINE VISCOUS HCL 2 % MT SOLN
15.0000 mL | Freq: Once | OROMUCOSAL | Status: AC
  Administered 2021-07-01: 15 mL via ORAL
  Filled 2021-07-01: qty 15

## 2021-07-01 NOTE — Discharge Instructions (Addendum)
Please keep your appointment with the gastroenterologist tomorrow.  Remember that if you take Pepcid please take it on an empty stomach in the morning and wait 30 minutes until eating.  It was a pleasure to meet you and I hope that you feel better.

## 2021-07-01 NOTE — ED Triage Notes (Signed)
Pt c/o left/mid abdominal pain x2days. Pt reports being seen yesterday at ED and had blood work and referral. Reports 1 occurrence of emesis yesterday.   Reports taking Pepcid yesterday with no relief.    A/Ox4 Ambulatory in triage

## 2021-07-01 NOTE — ED Provider Notes (Signed)
[facility]-EMERGENCY DEPT Provider Note   CSN: [id number] Arrival date & time: 07/01/21  0940     History Chief Complaint  Patient presents with   Abdominal Pain    Randy Hamilton is a 30 y.o. male with a past medical history of reflux presenting today with continued symptoms since his visit yesterday.  He was fully evaluated yesterday and diagnosed with GERD.  Instructed to take Pepcid and Tylenol.  Today he is reporting continued pain.  No vomiting or diarrhea since yesterday.  Referred to a gastroenterologist yesterday and has an appointment with them tomorrow.   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   Non-traumatic rhabdomyolysis 10/04/2017   Rupture of left patellar tendon 07/12/2015   Patellar tendon rupture 07/12/2015    Past Surgical History:  Procedure Laterality Date   epidermal cyst removed from testicle      PATELLAR TENDON REPAIR Left 07/12/2015   Procedure: DIRECT PRIMARY REPAIR LEFT PATELLA TENDON;  Surgeon: Kathryne Hitch, MD;  Location: [facility];  Service: Orthopedics;  Laterality: Left;       Family History  Problem Relation Age of Onset   Hypertension Mother    Cancer Mother    Cancer Brother    Cancer Other     Social History   Tobacco Use   Smoking status: Never   Smokeless tobacco: Never  Vaping Use   Vaping Use: Never used  Substance Use Topics   Alcohol use: Yes    Comment: occ   Drug use: No    Home Medications Prior to Admission medications   Medication Sig Start Date End Date Taking? Authorizing Provider  methocarbamol (ROBAXIN) 500 MG tablet Take 1 tablet (500 mg total) by mouth 2 (two) times daily. 12/20/17   Hedges, Tinnie Gens, PA-C  oxyCODONE-acetaminophen (PERCOCET) 5-325 MG tablet Take 1-2 tablets by mouth every 4 (four) hours as needed for severe pain. Patient not  taking: Reported on 10/04/2017 07/12/15   Kathryne Hitch, MD    Allergies    Patient has no known allergies.  Review of Systems   Review of Systems  Constitutional:  Negative for chills and fever.  Gastrointestinal:  Positive for abdominal pain, diarrhea, nausea and vomiting.  Neurological:  Negative for dizziness.   Physical Exam Updated Vital Signs BP 127/77 (BP Location: Right Arm)   Pulse 68   Temp 97.8 F (36.6 C) (Oral)   Resp 16   SpO2 98%   Physical Exam Vitals and nursing note reviewed.  Constitutional:      General: He is not in acute distress.    Appearance: Normal appearance. He is not ill-appearing.  HENT:     Head: Normocephalic and atraumatic.  Eyes:     General: No scleral icterus.    Conjunctiva/sclera: Conjunctivae normal.  Cardiovascular:     Rate and Rhythm: Normal rate and regular rhythm.  Pulmonary:     Effort: Pulmonary effort is normal. No respiratory distress.  Abdominal:     Palpations: Abdomen is soft.     Hernia: No hernia is present.  Skin:    Findings: No rash.  Neurological:     Mental Status: He is alert.  Psychiatric:        Mood and Affect: Mood normal.    ED Results / Procedures / Treatments  Labs (all labs ordered are listed, but only abnormal results are displayed) Labs Reviewed - No data to display  EKG None  Radiology No results found.  Procedures Procedures   Medications Ordered in ED Medications  alum & mag hydroxide-simeth (MAALOX/MYLANTA) 200-200-20 MG/5ML suspension 30 mL (has no administration in time range)    And  lidocaine (XYLOCAINE) 2 % viscous mouth solution 15 mL (has no administration in time range)    ED Course  I have reviewed the triage vital signs and the nursing notes.  Pertinent labs & imaging results that were available during my care of the patient were reviewed by me and considered in my medical decision making (see chart for details).    MDM Rules/Calculators/A&P Patient  evaluated by me.  Was in no acute distress.  Symptoms have not changed since yesterday however he felt as though he did not fully understand what he was supposed to do.  Girlfriend is a Engineer, civil (consulting) and suggested that he come back because her symptoms have not improved.  He has a appointment with a gastroenterologist tomorrow to discuss his symptoms.  I instructed him on proper use of Pepcid however I will not prescribe any medications, and allow the specialist to handle this tomorrow.  He will be treated with a GI cocktail and discharged home with follow-up.  Final Clinical Impression(s) / ED Diagnoses Final diagnoses:  Gastroesophageal reflux disease, unspecified whether esophagitis present    Rx / DC Orders Results and diagnoses were explained to the patient. Return precautions discussed in full. Patient had no additional questions and expressed complete understanding.     Woodroe Chen 07/01/21 1115    Lorre Nick, MD 07/02/21 [phone]

## 2021-07-02 ENCOUNTER — Other Ambulatory Visit: Payer: Self-pay

## 2021-07-02 ENCOUNTER — Ambulatory Visit
Admission: RE | Admit: 2021-07-02 | Discharge: 2021-07-02 | Disposition: A | Discharge status: Home / Self Care | Payer: 59 | Source: Ambulatory Visit | Attending: Gastroenterology | Admitting: Gastroenterology

## 2021-07-02 ENCOUNTER — Other Ambulatory Visit: Payer: Self-pay | Admitting: Gastroenterology

## 2021-07-02 DIAGNOSIS — K5900 Constipation, unspecified: Secondary | ICD-10-CM

## 2021-07-03 ENCOUNTER — Other Ambulatory Visit: Payer: Self-pay | Admitting: Gastroenterology

## 2021-07-03 DIAGNOSIS — R935 Abnormal findings on diagnostic imaging of other abdominal regions, including retroperitoneum: Secondary | ICD-10-CM

## 2021-07-03 DIAGNOSIS — R109 Unspecified abdominal pain: Secondary | ICD-10-CM

## 2021-07-28 ENCOUNTER — Ambulatory Visit
Admission: RE | Admit: 2021-07-28 | Discharge: 2021-07-28 | Disposition: A | Discharge status: Home / Self Care | Payer: 59 | Source: Ambulatory Visit | Attending: Gastroenterology | Admitting: Gastroenterology

## 2021-07-28 DIAGNOSIS — R109 Unspecified abdominal pain: Secondary | ICD-10-CM

## 2021-07-28 DIAGNOSIS — R935 Abnormal findings on diagnostic imaging of other abdominal regions, including retroperitoneum: Secondary | ICD-10-CM

## 2021-07-28 MED ORDER — IOPAMIDOL (ISOVUE-300) INJECTION 61%
100.0000 mL | Freq: Once | INTRAVENOUS | Status: AC | PRN
  Administered 2021-07-28: 100 mL via INTRAVENOUS
